# Patient Record
Sex: Female | Born: 1965
Health system: Southern US, Community
[De-identification: ages and names within clinical notes are randomized; demographics above are authoritative.]

## PROBLEM LIST (undated history)

## (undated) DIAGNOSIS — L509 Urticaria, unspecified: Secondary | ICD-10-CM

## (undated) DIAGNOSIS — R87629 Unspecified abnormal cytological findings in specimens from vagina: Secondary | ICD-10-CM

## (undated) DIAGNOSIS — E890 Postprocedural hypothyroidism: Secondary | ICD-10-CM

## (undated) DIAGNOSIS — T7840XA Allergy, unspecified, initial encounter: Secondary | ICD-10-CM

## (undated) DIAGNOSIS — E0500 Thyrotoxicosis with diffuse goiter without thyrotoxic crisis or storm: Secondary | ICD-10-CM

## (undated) HISTORY — PX: TONSILLECTOMY AND ADENOIDECTOMY: SHX28

## (undated) HISTORY — PX: ESOPHAGOGASTRODUODENOSCOPY: SHX1529

## (undated) HISTORY — DX: Urticaria, unspecified: L50.9

## (undated) HISTORY — PX: COLONOSCOPY: SHX174

## (undated) HISTORY — DX: Thyrotoxicosis with diffuse goiter without thyrotoxic crisis or storm: E05.00

## (undated) HISTORY — DX: Postprocedural hypothyroidism: E89.0

## (undated) HISTORY — DX: Unspecified abnormal cytological findings in specimens from vagina: R87.629

## (undated) HISTORY — PX: OTHER SURGICAL HISTORY: SHX169

## (undated) HISTORY — DX: Allergy, unspecified, initial encounter: T78.40XA

## (undated) HISTORY — PX: LEEP: SHX91

---

## 1998-10-28 ENCOUNTER — Inpatient Hospital Stay (HOSPITAL_COMMUNITY): Admission: AD | Admit: 1998-10-28 | Discharge: 1998-10-29 | Payer: Self-pay | Admitting: Obstetrics & Gynecology

## 1998-12-09 ENCOUNTER — Other Ambulatory Visit: Admission: RE | Admit: 1998-12-09 | Discharge: 1998-12-09 | Payer: Self-pay | Admitting: Obstetrics & Gynecology

## 1999-01-12 ENCOUNTER — Encounter: Admission: RE | Admit: 1999-01-12 | Discharge: 1999-04-05 | Payer: Self-pay | Admitting: Anesthesiology

## 1999-03-15 ENCOUNTER — Encounter: Admission: RE | Admit: 1999-03-15 | Discharge: 1999-06-13 | Payer: Self-pay | Admitting: Anesthesiology

## 1999-12-28 ENCOUNTER — Other Ambulatory Visit: Admission: RE | Admit: 1999-12-28 | Discharge: 1999-12-28 | Payer: Self-pay | Admitting: Obstetrics & Gynecology

## 2000-06-19 ENCOUNTER — Other Ambulatory Visit: Admission: RE | Admit: 2000-06-19 | Discharge: 2000-06-19 | Payer: Self-pay | Admitting: Obstetrics & Gynecology

## 2000-11-01 ENCOUNTER — Other Ambulatory Visit: Admission: RE | Admit: 2000-11-01 | Discharge: 2000-11-01 | Payer: Self-pay | Admitting: Obstetrics & Gynecology

## 2001-05-22 ENCOUNTER — Inpatient Hospital Stay (HOSPITAL_COMMUNITY): Admission: AD | Admit: 2001-05-22 | Discharge: 2001-05-24 | Payer: Self-pay | Admitting: Obstetrics & Gynecology

## 2001-05-22 ENCOUNTER — Encounter (INDEPENDENT_AMBULATORY_CARE_PROVIDER_SITE_OTHER): Payer: Self-pay | Admitting: Specialist

## 2001-07-23 ENCOUNTER — Other Ambulatory Visit: Admission: RE | Admit: 2001-07-23 | Discharge: 2001-07-23 | Payer: Self-pay | Admitting: Obstetrics & Gynecology

## 2002-08-20 ENCOUNTER — Other Ambulatory Visit: Admission: RE | Admit: 2002-08-20 | Discharge: 2002-08-20 | Payer: Self-pay | Admitting: Obstetrics & Gynecology

## 2003-09-15 ENCOUNTER — Other Ambulatory Visit: Admission: RE | Admit: 2003-09-15 | Discharge: 2003-09-15 | Payer: Self-pay | Admitting: Obstetrics & Gynecology

## 2004-10-06 ENCOUNTER — Other Ambulatory Visit: Admission: RE | Admit: 2004-10-06 | Discharge: 2004-10-06 | Payer: Self-pay | Admitting: Obstetrics & Gynecology

## 2005-11-04 ENCOUNTER — Other Ambulatory Visit: Admission: RE | Admit: 2005-11-04 | Discharge: 2005-11-04 | Payer: Self-pay | Admitting: Obstetrics and Gynecology

## 2010-04-29 ENCOUNTER — Encounter: Payer: Self-pay | Admitting: Critical Care Medicine

## 2010-06-15 ENCOUNTER — Ambulatory Visit: Payer: Self-pay | Admitting: Critical Care Medicine

## 2010-06-15 DIAGNOSIS — R059 Cough, unspecified: Secondary | ICD-10-CM | POA: Insufficient documentation

## 2010-06-15 DIAGNOSIS — E0590 Thyrotoxicosis, unspecified without thyrotoxic crisis or storm: Secondary | ICD-10-CM | POA: Insufficient documentation

## 2010-06-15 DIAGNOSIS — R05 Cough: Secondary | ICD-10-CM | POA: Insufficient documentation

## 2010-06-17 ENCOUNTER — Encounter: Payer: Self-pay | Admitting: Critical Care Medicine

## 2010-07-05 ENCOUNTER — Ambulatory Visit: Payer: Self-pay | Admitting: Critical Care Medicine

## 2010-07-05 DIAGNOSIS — J309 Allergic rhinitis, unspecified: Secondary | ICD-10-CM | POA: Insufficient documentation

## 2010-07-05 DIAGNOSIS — K219 Gastro-esophageal reflux disease without esophagitis: Secondary | ICD-10-CM | POA: Insufficient documentation

## 2010-10-19 NOTE — Assessment & Plan Note (Signed)
Summary: Pulmonary Consultation   Copy to:  Dr. Trinna Post Primary Provider/Referring Provider:  na  CC:  Pulmonary Consult for cough..  History of Present Illness: Pulmonary Consultation     This is a 45 year old female who presents with cough.  The patient complains of cough, but denies history of diagnosed COPD, shortness of breath, chest tightness, chest pain worse with breathing and coughing, wheezing, mucous production, nocturnal awakening, exercise induced symptoms, and congestion.  The cough is described as non-productive, perceived to be from upper airway, does not interrupt sleep, does not occur after eating, and without wheezing.  The dyspnea is described as insignificant.  The patient reports a history of thyroid disease.  The patient denies any history of asthma, allergic rhinitis, COPD, sleep disordered breathing, obstructive sleep apnea, heart disease, anemia, collagen vascular disease, osteporosis, kyphoscoliosis, occupational exposure: , and cancer: .  Ineffective therapies have included beta agonists, antihistamines, and PPI's.    this pt has had a dry cough since 7/11.   ? trigger.  Pt had this in the past but not as severe as now. Past trigger was sinus infxn but pt denies one now.  The pt awakens with chest pain that lasts .  Cough is dry.  No cough at bedtime.  Cough is dry only.  No heartburn , no sour brash taste.  Pt feels chest is tight and heavy.    No pn drip.  Pt took antihistamine and ppi for one month without much change.  The pt has never been dx asthma .  The pt  was rx with albuterol and no effect  The pt had allergy blood test and was neg. The pt is not hoarse  The pt has had this type of cough before but always around a sinus infxn rx pred x 10days with no help. zpak did not make any difference     never dx asthma  was rx with albuterol and no effect  had allergy blood test and was neg. is not hoarse  has had this type of cough before but  always around a sinus infxn rx pred x 10days with no help. zpak did not make any difference   Preventive Screening-Counseling & Management  Alcohol-Tobacco     Smoking Status: never     Passive Smoke Exposure: yes  Pulmonary Function Test Date: 06/15/2010 Height (in.): 67 Gender: Female  Pre-Spirometry FVC    Value: 3.84 L/min   Pred: 3.85 L/min     % Pred: 100 % FEV1    Value: 3.12 L     Pred: 2.98 L     % Pred: 105 % FEV1/FVC  Value: 81 %     Pred: 77 %    FEF 25-75  Value: 3.44 L/min   Pred: 3.31 L/min     % Pred: 104 %  Current Medications (verified): 1)  Synthroid 112 Mcg Tabs (Levothyroxine Sodium) .... Take 1 Tablet By Mouth Once A Day 2)  Birth Control .... Take 1 Tablet By Mouth Once A Day  Allergies (verified): No Known Drug Allergies  Past History:  Past medical, surgical, family and social histories (including risk factors) reviewed, and no changes noted (except as noted below).  Past Medical History: HYPERTHYROIDISM (ICD-242.90)  Past Surgical History: Tonsillectomy at age 76  Family History: Reviewed history and no changes required. father deceased from lung ca   Social History: Reviewed history and no changes required. 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  The patient denies shortness of breath with activity, productive cough, coughing up blood, irregular heartbeats, acid heartburn, indigestion, loss of appetite, weight change, abdominal pain, difficulty swallowing, sore throat, tooth/dental problems, headaches, nasal congestion/difficulty breathing through nose, sneezing, itching, ear ache, anxiety, depression, hand/feet swelling, joint stiffness or pain, rash, change in color of mucus, and fever.        See HPI for Pulmonary, Cardiac,  General, and ENT review of systems.  Vital Signs:  Patient profile:   45 year old female Height:      67 inches Weight:      138.38 pounds BMI:     21.75 O2 Sat:      97 % on Room air Temp:     98.6 degrees F oral Pulse rate:   96 / minute BP sitting:   108 / 86  (left arm) Cuff size:   regular  Vitals Entered By: Gweneth Dimitri RN (June 15, 2010 2:48 PM)  O2 Flow:  Room air CC: Pulmonary Consult for cough. Comments Medications reviewed with patient Daytime contact number verified with patient. Gweneth Dimitri RN  June 15, 2010 2:48 PM    Physical Exam  Additional Exam:  Gen: Pleasant, well-nourished, in no distress,  normal affect ENT: No lesions,  mouth clear,  oropharynx clear, no postnasal drip Neck: No JVD, no TMG, no carotid bruits Lungs: No use of accessory muscles, no dullness to percussion, clear without rales or rhonchi Cardiovascular: RRR, heart sounds normal, no murmur or gallops, no peripheral edema Abdomen: soft and NT, no HSM,  BS normal Musculoskeletal: No deformities, no cyanosis or clubbing Neuro: alert, non focal Skin: Warm, no lesions or rashes    CXR  Procedure date:  04/29/2010  Findings:      No active disease   Pulmonary Function Test Date: 06/15/2010 Height (in.): 67 Gender: Female  Pre-Spirometry FVC    Value: 3.84 L/min   Pred: 3.85 L/min     % Pred: 100 % FEV1    Value: 3.12 L     Pred: 2.98 L     % Pred: 105 % FEV1/FVC  Value: 81 %     Pred: 77 %    FEF 25-75  Value: 3.44 L/min   Pred: 3.31 L/min     % Pred: 104 %  Impression & Recommendations:  Problem # 1:  COUGH (ICD-786.2) Assessment Deteriorated Cyclic cough with mulitple potential triggers including chronic rhinitis, GERD, poss reactive airway disease, atopic,  note high IgE 42 though spirometry was normal plan trial ICS asmanex two puff daily pulse prednisone cyclic cough protocol with tussicaps/tessalon PPI daily chlorpheniramine 12mg /d  Orders: New  Patient Level V (45409) T-IgE (Immunoglobulin E) (81191-47829) Spirometry w/Graph (94010)  Medications Added to Medication List This Visit: 1)  Synthroid 112 Mcg Tabs (Levothyroxine sodium) .... Take 1 tablet by mouth once a day 2)  Birth Control  .... Take 1 tablet by mouth once a day 3)  Prednisone 10 Mg Tabs (Prednisone) .... Take as directed 4 each am x3days, 3 x 3days, 2 x 3days, 1 x 3days then stop 4)  Asmanex 120 Metered Doses 220 Mcg/inh Aepb (Mometasone furoate) .... Two puffs nightly 5)  Benzonatate 100 Mg Caps (Benzonatate) .... One to two every 4hours as needed cough 6)  Chlorpheniramine Maleate 12 Mg Cr-tabs (Chlorpheniramine maleate) .... One by mouth at bedtime 7)  Tussicaps 10-8 Mg Xr12h-cap (Hydrocod polst-chlorphen polst) .... One by mouth two times a day as needed cough 8)  Omeprazole 20 Mg Cpdr (Omeprazole) .... By mouth daily. take one half hour before eating.  Complete Medication List: 1)  Synthroid 112 Mcg Tabs (Levothyroxine sodium) .... Take 1 tablet by mouth once a day 2)  Birth Control  .... Take 1 tablet by mouth once a day 3)  Prednisone 10 Mg Tabs (Prednisone) .... Take as directed 4 each am x3days, 3 x 3days, 2 x 3days, 1 x 3days then stop 4)  Asmanex 120 Metered Doses 220 Mcg/inh Aepb (Mometasone furoate) .... Two puffs nightly 5)  Benzonatate 100 Mg Caps (Benzonatate) .... One to two every 4hours as needed cough 6)  Chlorpheniramine Maleate 12 Mg Cr-tabs (Chlorpheniramine maleate) .... One by mouth at bedtime 7)  Tussicaps 10-8 Mg Xr12h-cap (Hydrocod polst-chlorphen polst) .... One by mouth two times a day as needed cough 8)  Omeprazole 20 Mg Cpdr (Omeprazole) .... By mouth daily. take one half hour before eating.  Patient Instructions: 1)  Cyclic cough protocol using Tussicaps/Tessalon 2)  Start Asmanex two puff daily 3)  Recycle prednisone 4)  Resume omeprazole one daily 5)  Follow reflux diet 6)  Labs today 7)  No mints,  no peppermints,  no cough  drops, keep sugar free candy in mouth and train self to swallow 8)  Chlorpheniramine 12mg  nightly Prescriptions: OMEPRAZOLE 20 MG  CPDR (OMEPRAZOLE) By mouth daily. Take one half hour before eating.  #30 x 0   Entered and Authorized by:   Storm Frisk MD   Signed by:   Storm Frisk MD on 06/15/2010   Method used:   Electronically to        [facility] [address] [phone]* (retail)       [address]       [place], [place]  [address]       Ph: [phone] or [phone]       Fax: [phone]   RxID:   [id number] TUSSICAPS 10-8 MG XR12H-CAP (HYDROCOD POLST-CHLORPHEN POLST) One by mouth two times a day as needed cough  #14 x 0   Entered and Authorized by:   Storm Frisk MD   Signed by:   Storm Frisk MD on 06/15/2010   Method used:   Print then Give to Patient   RxID:   [id number] CHLORPHENIRAMINE MALEATE 12 MG CR-TABS (CHLORPHENIRAMINE MALEATE) one by mouth at bedtime  #30 x 1   Entered and Authorized by:   Storm Frisk MD   Signed by:   Storm Frisk MD on 06/15/2010   Method used:   Electronically to        [facility] [address] [phone]* (retail)       [address]       [place], [place]  [address]       Ph: [phone] or [phone]       Fax: [phone]   RxID:   [id number] BENZONATATE 100 MG CAPS (BENZONATATE) one to two every 4hours as needed cough  #90 x 1   Entered and Authorized by:   Storm Frisk MD   Signed by:   Storm Frisk MD on 06/15/2010   Method used:   Electronically to        [facility] [address] [phone]* (retail)  [address]       [place], [place]  [address]       Ph: [phone] or [phone]       Fax: [phone]   RxID:   [id number] [facility] 120 METERED DOSES 220 MCG/INH  AEPB (MOMETASONE FUROATE) two puffs nightly  #1 x 6   Entered and Authorized by:   Storm Frisk MD   Signed by:   Storm Frisk MD on 06/15/2010   Method used:   [facility]ronically to        [facility]  [address] [phone]* (retail)       [address]       [place], [place]  [address]       Ph: [phone] or [phone]       Fax: [phone]   RxID:   [id number] PREDNISONE 10 MG  TABS (PREDNISONE) Take as directed 4 each am x3days, 3 x 3days, 2 x 3days, 1 x 3days then stop  #30 x 0   Entered and Authorized by:   Storm Frisk MD   Signed by:   Storm Frisk MD on 06/15/2010   Method used:   [facility]ronically to        [facility] [phone]* (retail)       [address]       [place], [place]  [address]       Ph: [phone] or [phone]       Fax: [phone]   RxID:   [id number]    Immunization History:  Influenza Immunization History:    Influenza:  historical (06/19/2009)

## 2010-10-19 NOTE — Assessment & Plan Note (Signed)
Summary: Pulmonary OV   Copy to:  Dr. Trinna Post Primary Provider/Referring Provider:  na  CC:  2 wk follow up.  Pt states cough is 90% better.  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  this pt has had a dry cough since 7/11.   ? trigger.  Pt had this in the past but not as severe as now. Past trigger was sinus infxn but pt denies one now.  The pt awakens with chest pain that lasts .  Cough is dry.  No cough at bedtime.  Cough is dry only.  No heartburn , no sour brash taste.  Pt feels chest is tight and heavy.    No pn drip.  Pt took antihistamine and ppi for one month without much change.  The pt has never been dx asthma .  The pt  was rx with albuterol and no effect  The pt had allergy blood test and was neg. The pt is not hoarse  The pt has had this type of cough before but always around a sinus infxn rx pred x 10days with no help. zpak did not make any difference July 05, 2010 10:25 AM At last ov we rec: Cyclic cough protocol using Tussicaps/Tessalon Start Asmanex two puff daily Recycle prednisone Resume omeprazole one daily Follow reflux diet  The cough is not completely gone but is 90% better. The pt uses OTC chlorpheniramine prn no real heart burn.     THe igE was high Asmanex and GERD rx has helped.  Pt on reflux diet  Current Medications (verified): 1)  Synthroid 112 Mcg Tabs (Levothyroxine Sodium) .... Take 1 Tablet By Mouth Once A Day 2)  Birth Control .... Take 1 Tablet By Mouth Once A Day 3)  Asmanex 120 Metered Doses 220 Mcg/inh  Aepb (Mometasone Furoate) .... Two Puffs Nightly 4)  Benzonatate 100 Mg Caps (Benzonatate) .... One To Two Every 4hours As Needed Cough 5)  Chlorpheniramine Maleate 12 Mg Cr-Tabs (Chlorpheniramine Maleate) .... One By Mouth At Bedtime 6)  Tussicaps 10-8 Mg Xr12h-Cap (Hydrocod  Polst-Chlorphen Polst) .... One By Mouth Two Times A Day As Needed Cough 7)  Omeprazole 20 Mg  Cpdr (Omeprazole) .... By Mouth Daily. Take One Half Hour Before Eating.  Allergies (verified): No Known Drug Allergies  Past History:  Past medical, surgical, family and social histories (including risk factors) reviewed, and no changes noted (except as noted below).  Past Medical History: Reviewed history from 06/15/2010 and no changes required. HYPERTHYROIDISM (ICD-242.90)  Past Surgical History: Reviewed history from 06/15/2010 and no changes required. Tonsillectomy at age 41  Family History: Reviewed history from 06/15/2010 and no changes required. father deceased from lung ca   Social History: Reviewed history from 06/15/2010 and no changes required. 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  The patient denies shortness of breath with activity, shortness of breath at rest, productive cough, coughing up blood, chest pain, irregular heartbeats, acid heartburn, indigestion, loss of appetite, weight change, abdominal pain, difficulty swallowing, sore throat, tooth/dental problems, headaches, nasal congestion/difficulty breathing through nose, sneezing, itching, ear ache, anxiety, depression, hand/feet swelling, joint stiffness or pain, rash, change in color of mucus, and fever.    Vital Signs:  Patient profile:  45 year old female Height:      67 inches Weight:      138.25 pounds BMI:     21.73 O2 Sat:      96 % on Room air Temp:     98.2 degrees F oral Pulse rate:   97 / minute BP sitting:   98 / 66  (right arm) Cuff size:   regular  Vitals Entered By: Gweneth Dimitri RN (July 05, 2010 10:20 AM)  O2 Flow:  Room air CC: 2 wk follow up.  Pt states cough is 90% better.  Requesting flu vac today. Comments Medications reviewed with patient Daytime contact  number verified with patient. Crystal Jones RN  July 05, 2010 10:20 AM    Physical Exam  Additional Exam:  Gen: Pleasant, well-nourished, in no distress,  normal affect ENT: No lesions,  mouth clear,  oropharynx clear, no postnasal drip Neck: No JVD, no TMG, no carotid bruits Lungs: No use of accessory muscles, no dullness to percussion, clear without rales or rhonchi Cardiovascular: RRR, heart sounds normal, no murmur or gallops, no peripheral edema Abdomen: soft and NT, no HSM,  BS normal Musculoskeletal: No deformities, no cyanosis or clubbing Neuro: alert, non focal Skin: Warm, no lesions or rashes    Impression & Recommendations:  Problem # 1:  COUGH (ICD-786.2) Assessment Improved Cyclical cough due to cough variant asthma, GERD, allergic rhinitis with atopy, post nasal drip syndrome,  all better with GERD rx, antihistamine, ICS plan wean off PPI and ICS as needed antihistamine cont reflux diet flu vaccine Orders: Est. Patient Level III ([address])  Complete Medication List: 1)  Synthroid 112 Mcg Tabs (Levothyroxine sodium) .... Take 1 tablet by mouth once a day 2)  Birth Control  .... Take 1 tablet by mouth once a day 3)  Asmanex 120 Metered Doses 220 Mcg/inh Aepb (Mometasone furoate) .... Two puffs nightly 4)  Benzonatate 100 Mg Caps (Benzonatate) .... One to two every 4hours as needed cough 5)  Chlorpheniramine Maleate 12 Mg Cr-tabs (Chlorpheniramine maleate) .... One by mouth at bedtime 6)  Tussicaps 10-8 Mg Xr12h-cap (Hydrocod polst-chlorphen polst) .... One by mouth two times a day as needed cough 7)  Omeprazole 20 Mg Cpdr (Omeprazole) .... By mouth daily. take one half hour before eating.  Other Orders: Admin 1st Vaccine (91478) Flu Vaccine 35yrs + [phone])  Patient Instructions: 1)  When current asmanex runs out, do not refill 2)  Use chlorpheniramine as needed 3)  When current omeprazole runs out, do not refill 4)  Focus on reflux diet 5)  If cough  worsens, call for Office Visit 6)  Return 4-5 months for recheck 7)  Will hold off on skin testing for now 8)  Flu vaccine today           Flu Vaccine Consent Questions     Do you have a history of severe allergic reactions to this vaccine? no    Any prior history of allergic reactions to egg and/or gelatin? no    Do you have a sensitivity to the preservative Thimersol? no    Do you have a past history of Guillan-Barre Syndrome? no    Do you currently have an acute febrile illness? no    Have you ever had a severe reaction to latex? no    Vaccine information given and explained to patient? yes    Are you currently pregnant? no    Lot Number:[id number]   Exp Date:03/19/2011   Site Given  Left Deltoid IMlu Gweneth Dimitri RN  July 05, 2010 10:40 AM  Appended Document: Pulmonary OV fax Trinna Post

## 2010-10-19 NOTE — Miscellaneous (Signed)
Summary: Spirometry  Clinical Lists Changes  Observations: Added new observation of PFT RSLT: normal (06/17/2010 14:12) Added new observation of FEF % EXPEC: 104 % (06/17/2010 14:12) Added new observation of FEF25-75%PRE: 3.31 L/min (06/17/2010 14:12) Added new observation of FEF 25-75%: 3.44 L/min (06/17/2010 14:12) Added new observation of FEV1/FVC PRE: 77 % (06/17/2010 14:12) Added new observation of FEV1/FVC: 81 % (06/17/2010 14:12) Added new observation of FEV1 % EXP: 105 % (06/17/2010 14:12) Added new observation of FEV1 PREDICT: 2.98 L (06/17/2010 14:12) Added new observation of FEV1: 3.12 L (06/17/2010 14:12) Added new observation of FVC % EXPECT: 100 % (06/17/2010 14:12) Added new observation of FVC PREDICT: 3.85 L (06/17/2010 14:12) Added new observation of FVC: 3.84 L (06/17/2010 14:12) Added new observation of PFT DATE: 06/15/2010  (06/17/2010 14:12)      Pulmonary Function Test Date: 06/15/2010 Height (in.): 67 Gender: Female  Pre-Spirometry FVC    Value: 3.84 L/min   Pred: 3.85 L/min     % Pred: 100 % FEV1    Value: 3.12 L     Pred: 2.98 L     % Pred: 105 % FEV1/FVC  Value: 81 %     Pred: 77 %    FEF 25-75  Value: 3.44 L/min   Pred: 3.31 L/min     % Pred: 104 %  Evaluation: normal  Appended Document: Spirometry result noted  patient aware

## 2011-02-04 NOTE — Op Note (Signed)
[facility]  Patient:    Shelly James, RISSMILLER Visit Number: [id number] MRN: [id number]          Service Type: OBS Location: 910A [id number] [id number] Attending Physician:  Soledad Gerlach Dictated by:   Guy Sandifer Arleta Creek, M.D. Proc. Date: 05/22/01 Admit Date:  05/22/2001                             Operative Report  SURGEON:                      Guy Sandifer. Arleta Creek, M.D.  INDICATIONS AND CONSENT:      This patient is a 45 year old married white female, G2, P1 with an EDC of May 29, 2001, placing her at 39-0/7 weeks. Her prenatal care has been complicated by partial placental previa, which resolved up repeat ultrasound examination at approximately 24 weeks.  The patient presented to labor and delivery complaining of uterine contractions. Group B strep culture was negative.  She was noted to have more than the average amount of bleeding, possibly consistent with heavy show, but not consistent with continued stream of vaginal bleeding.  Her cervix was 5 cm dilated, completely effaced with a bulging bag.  Fetal heart tones were reactive and contractions were approximately every three minutes and firm.  IV access was started.  Laboratories were sent including a DIC panel, which subsequently returned as normal.  DELIVERY NOTE:                The patient was admitted to the hospital and taken to labor and delivery.  She requested and epidural.  Examination prior to the epidural was 8 cm dilation with a bulging bag.  Epidural was placed and a second IV was also started.  The patient continued to have bleeding which would soil a pad but, again, was not a free flow of dark red blood.  After placement of the epidural, she had some deep variable-shaped decelerations, which were late in occurrence.  With IV fluid resuscitation and oxygen, these gradually resolved.  Artificial rupture of membranes for clear fluid was carried out.  The patient made rapid  progress to complete and pushing and with +3 station.  She was then set up for delivery and easily achieved a spontaneous vaginal delivery for a vigorous infant female child with Apgars of 8 and 9 at one and five minutes respectively.  Arterial cord pH was 7.27. Birth weight is pending.  A three-vessel cord was noted.  Upon delivery of the placenta, a clot was attached to 1/4 of the margin of the placental diameter consistent with a marginal abruption.  The placenta was sent to pathology. The uterus massaged down well.  Good hemostasis was noted.  She had a small anterior midline laceration beneath the clitoris, which was repaired with a single figure-of-eight 2-0 Rapide suture.  A small second-degree midline episiotomy was also repaired in the standard fashion.  The patient and infant are stable in the labor and delivery room. Dictated by:   Guy Sandifer Arleta Creek, M.D. Attending Physician:  Soledad Gerlach DD:  05/22/01 TD:  05/23/01 Job: [id number]

## 2011-10-27 ENCOUNTER — Other Ambulatory Visit: Payer: Self-pay | Admitting: Obstetrics & Gynecology

## 2011-10-27 DIAGNOSIS — R928 Other abnormal and inconclusive findings on diagnostic imaging of breast: Secondary | ICD-10-CM

## 2011-11-04 ENCOUNTER — Ambulatory Visit
Admission: RE | Admit: 2011-11-04 | Discharge: 2011-11-04 | Disposition: A | Discharge status: Home / Self Care | Payer: BC Managed Care – PPO | Source: Ambulatory Visit | Attending: Obstetrics & Gynecology | Admitting: Obstetrics & Gynecology

## 2011-11-04 DIAGNOSIS — R928 Other abnormal and inconclusive findings on diagnostic imaging of breast: Secondary | ICD-10-CM

## 2011-12-24 ENCOUNTER — Ambulatory Visit (INDEPENDENT_AMBULATORY_CARE_PROVIDER_SITE_OTHER): Payer: BC Managed Care – PPO | Admitting: Internal Medicine

## 2011-12-24 ENCOUNTER — Ambulatory Visit: Payer: BC Managed Care – PPO

## 2011-12-24 VITALS — BP 108/70 | HR 84 | Temp 98.4°F | Resp 18 | Ht 66.0 in | Wt 140.0 lb

## 2011-12-24 DIAGNOSIS — R0602 Shortness of breath: Secondary | ICD-10-CM

## 2011-12-24 DIAGNOSIS — J9801 Acute bronchospasm: Secondary | ICD-10-CM

## 2011-12-24 DIAGNOSIS — R05 Cough: Secondary | ICD-10-CM

## 2011-12-24 MED ORDER — ALBUTEROL SULFATE (2.5 MG/3ML) 0.083% IN NEBU
2.5000 mg | INHALATION_SOLUTION | Freq: Once | RESPIRATORY_TRACT | Status: AC
  Administered 2011-12-24: 2.5 mg via RESPIRATORY_TRACT

## 2011-12-24 MED ORDER — HYDROCODONE-ACETAMINOPHEN 7.5-500 MG/15ML PO SOLN: 5.0000 mL | Freq: Four times a day (QID) | ORAL | Status: AC | PRN

## 2011-12-24 MED ORDER — IPRATROPIUM BROMIDE 0.02 % IN SOLN
0.5000 mg | Freq: Once | RESPIRATORY_TRACT | Status: AC
  Administered 2011-12-24: 0.5 mg via RESPIRATORY_TRACT

## 2011-12-24 MED ORDER — ALBUTEROL SULFATE HFA 108 (90 BASE) MCG/ACT IN AERS: 2.0000 | INHALATION_SPRAY | Freq: Four times a day (QID) | RESPIRATORY_TRACT | Status: DC | PRN

## 2011-12-24 MED ORDER — BENZONATATE 200 MG PO CAPS: 200.0000 mg | ORAL_CAPSULE | Freq: Two times a day (BID) | ORAL | Status: AC | PRN

## 2011-12-24 NOTE — Patient Instructions (Signed)

## 2011-12-24 NOTE — Progress Notes (Signed)
  Subjective:    Patient ID: Shelly James, female    DOB: 1966/06/18, 46 y.o.   MRN: [id number]  HPI Cough, sneezing, runny nose PHx of cycxlical cough Has allergys No SOB   Review of Systems     Objective:   Physical Exam  Constitutional: She is oriented to person, place, and time. She appears well-developed and well-nourished.  HENT:  Nose: Mucosal edema and rhinorrhea present. Right sinus exhibits no maxillary sinus tenderness and no frontal sinus tenderness. Left sinus exhibits no maxillary sinus tenderness and no frontal sinus tenderness.  Mouth/Throat: Uvula is midline, oropharynx is clear and moist and mucous membranes are normal.  Cardiovascular: Normal rate, regular rhythm and normal heart sounds.   Pulmonary/Chest: Effort normal. No respiratory distress. She has no decreased breath sounds. She has wheezes. She has rhonchi. She has no rales. She exhibits no tenderness.  Neurological: She is alert and oriented to person, place, and time.  Skin: Skin is warm and dry.    PFR 400, normal 450 Post 410 with louder wheezes      [facility] reading (PRIMARY) by  Dr.Katianne Barre NAD.        Assessment & Plan:  Cough Allergic rhinitis Bronchospasm  Neb good response, less cough and breaths easier Meds odered

## 2012-01-14 ENCOUNTER — Ambulatory Visit (INDEPENDENT_AMBULATORY_CARE_PROVIDER_SITE_OTHER): Payer: BC Managed Care – PPO | Admitting: Internal Medicine

## 2012-01-14 VITALS — BP 108/67 | HR 81 | Temp 97.5°F | Resp 16 | Ht 66.0 in | Wt 138.0 lb

## 2012-01-14 DIAGNOSIS — Z789 Other specified health status: Secondary | ICD-10-CM

## 2012-01-14 DIAGNOSIS — G4700 Insomnia, unspecified: Secondary | ICD-10-CM

## 2012-01-14 DIAGNOSIS — J309 Allergic rhinitis, unspecified: Secondary | ICD-10-CM

## 2012-01-14 DIAGNOSIS — J9801 Acute bronchospasm: Secondary | ICD-10-CM

## 2012-01-14 DIAGNOSIS — R05 Cough: Secondary | ICD-10-CM

## 2012-01-14 MED ORDER — ALPRAZOLAM 0.25 MG PO TABS: 0.2500 mg | ORAL_TABLET | Freq: Two times a day (BID) | ORAL | Status: AC | PRN

## 2012-01-14 MED ORDER — IPRATROPIUM BROMIDE 0.02 % IN SOLN
0.5000 mg | Freq: Once | RESPIRATORY_TRACT | Status: AC
  Administered 2012-01-14: 0.5 mg via RESPIRATORY_TRACT

## 2012-01-14 MED ORDER — METHYLPREDNISOLONE ACETATE 80 MG/ML IJ SUSP
80.0000 mg | Freq: Once | INTRAMUSCULAR | Status: AC
  Administered 2012-01-14: 80 mg via INTRAMUSCULAR

## 2012-01-14 MED ORDER — FLUTICASONE PROPIONATE 50 MCG/ACT NA SUSP: 2.0000 | Freq: Every day | NASAL | Status: DC

## 2012-01-14 MED ORDER — ALBUTEROL SULFATE (2.5 MG/3ML) 0.083% IN NEBU
2.5000 mg | INHALATION_SOLUTION | Freq: Once | RESPIRATORY_TRACT | Status: AC
  Administered 2012-01-14: 2.5 mg via RESPIRATORY_TRACT

## 2012-01-14 MED ORDER — BECLOMETHASONE DIPROPIONATE 80 MCG/ACT IN AERS: 1.0000 | INHALATION_SPRAY | RESPIRATORY_TRACT | Status: DC | PRN

## 2012-01-14 NOTE — Patient Instructions (Signed)
Prednisone tablets What is this medicine? PREDNISONE (PRED ni sone) is a corticosteroid. It is commonly used to treat inflammation of the skin, joints, lungs, and other organs. Common conditions treated include asthma, allergies, and arthritis. It is also used for other conditions, such as blood disorders and diseases of the adrenal glands. This medicine may be used for other purposes; ask your health care provider or pharmacist if you have questions. What should I tell my health care provider before I take this medicine? They need to know if you have any of these conditions: -Cushing's syndrome -diabetes -glaucoma -heart disease -high blood pressure -infection (especially a virus infection such as chickenpox, cold sores, or herpes) -kidney disease -liver disease -mental illness -myasthenia gravis -osteoporosis -seizures -stomach or intestine problems -thyroid disease -an unusual or allergic reaction to lactose, prednisone, other medicines, foods, dyes, or preservatives -pregnant or trying to get pregnant -breast-feeding How should I use this medicine? Take this medicine by mouth with a glass of water. Follow the directions on the prescription label. Take this medicine with food. If you are taking this medicine once a day, take it in the morning. Do not take more medicine than you are told to take. Do not suddenly stop taking your medicine because you may develop a severe reaction. Your doctor will tell you how much medicine to take. If your doctor wants you to stop the medicine, the dose may be slowly lowered over time to avoid any side effects. Talk to your pediatrician regarding the use of this medicine in children. Special care may be needed. Overdosage: If you think you have taken too much of this medicine contact a poison control center or emergency room at once. NOTE: This medicine is only for you. Do not share this medicine with others. What if I miss a dose? If you miss a dose,  take it as soon as you can. If it is almost time for your next dose, talk to your doctor or health care professional. You may need to miss a dose or take an extra dose. Do not take double or extra doses without advice. What may interact with this medicine? Do not take this medicine with any of the following medications: -metyrapone -mifepristone This medicine may also interact with the following medications: -aminoglutethimide -amphotericin B -aspirin and aspirin-like medicines -barbiturates -certain medicines for diabetes, like glipizide or glyburide -cholestyramine -cholinesterase inhibitors -cyclosporine -digoxin -diuretics -ephedrine -female hormones, like estrogens and birth control pills -isoniazid -ketoconazole -NSAIDS, medicines for pain and inflammation, like ibuprofen or naproxen -phenytoin -rifampin -toxoids -vaccines -warfarin This list may not describe all possible interactions. Give your health care provider a list of all the medicines, herbs, non-prescription drugs, or dietary supplements you use. Also tell them if you smoke, drink alcohol, or use illegal drugs. Some items may interact with your medicine. What should I watch for while using this medicine? Visit your doctor or health care professional for regular checks on your progress. If you are taking this medicine over a prolonged period, carry an identification card with your name and address, the type and dose of your medicine, and your doctor's name and address. This medicine may increase your risk of getting an infection. Tell your doctor or health care professional if you are around anyone with measles or chickenpox, or if you develop sores or blisters that do not heal properly. If you are going to have surgery, tell your doctor or health care professional that you have taken this medicine within the last   twelve months. Ask your doctor or health care professional about your diet. You may need to lower the amount  of salt you eat. This medicine may affect blood sugar levels. If you have diabetes, check with your doctor or health care professional before you change your diet or the dose of your diabetic medicine. What side effects may I notice from receiving this medicine? Side effects that you should report to your doctor or health care professional as soon as possible: -allergic reactions like skin rash, itching or hives, swelling of the face, lips, or tongue -changes in emotions or moods -changes in vision -depressed mood -eye pain -fever or chills, cough, sore throat, pain or difficulty passing urine -increased thirst -swelling of ankles, feet Side effects that usually do not require medical attention (report to your doctor or health care professional if they continue or are bothersome): -confusion, excitement, restlessness -headache -nausea, vomiting -skin problems, acne, thin and shiny skin -trouble sleeping -weight gain This list may not describe all possible side effects. Call your doctor for medical advice about side effects. You may report side effects to FDA at 1-800-FDA-1088. Where should I keep my medicine? Keep out of the reach of children. Store at room temperature between 15 and 30 degrees C (59 and 86 degrees F). Protect from light. Keep container tightly closed. Throw away any unused medicine after the expiration date. NOTE: This sheet is a summary. It may not cover all possible information. If you have questions about this medicine, talk to your doctor, pharmacist, or health care provider.  2012, [employer]. (04/21/2011 10:57:14 AM) 

## 2012-01-14 NOTE — Progress Notes (Signed)
  Subjective:    Patient ID: Shelly James, female    DOB: [birth year], 46 y.o.   MRN: [id number]  HPI Allergys, cough, wheezes have returned. Neb. Helped a lot, see last visit reviewed by me. No sxs of infection.   Review of Systems     Objective:   Physical Exam PFR 390 prior post 410 CXR report normal Nasal mucosa boggy edematous, lungs wheeze, rhonchi, cough spasms--all allergic in nature. Dr. Lucie Leather and [facility] her allergist.  Shelly James with albut and atrovent now       Assessment & Plan:  Start Qvar and fluticasone  Epocrates rev.  Depomedrol 80mg  IM

## 2012-06-28 ENCOUNTER — Ambulatory Visit (INDEPENDENT_AMBULATORY_CARE_PROVIDER_SITE_OTHER): Payer: BC Managed Care – PPO | Admitting: Family Medicine

## 2012-06-28 VITALS — BP 102/66 | HR 88 | Temp 98.0°F | Resp 16 | Ht 66.0 in | Wt 140.0 lb

## 2012-06-28 DIAGNOSIS — R230 Cyanosis: Secondary | ICD-10-CM

## 2012-06-28 DIAGNOSIS — J309 Allergic rhinitis, unspecified: Secondary | ICD-10-CM

## 2012-06-28 DIAGNOSIS — J3081 Allergic rhinitis due to animal (cat) (dog) hair and dander: Secondary | ICD-10-CM

## 2012-06-28 MED ORDER — PREDNISONE 20 MG PO TABS: ORAL_TABLET | ORAL | Status: DC

## 2012-06-28 NOTE — Progress Notes (Addendum)
[facility] [address], [place] [address] [phone]  Date:  06/28/2012   Name:  Shelly James   DOB:  [birth year]   MRN:  [id number]  PCP:  No primary provider on file.    Chief Complaint: Sinusitis, Cough and Hand Problem   History of Present Illness:  Shelly James is a 46 y.o. very pleasant female patient who presents with the following:  She is here today with allergy type symptoms which seem to have settled into her chest.  She feels that she cannot catch her breath.   She is using zyrtec, flonase and qvar.  Her symptoms started with nasal congestion, then a ST, now the wheezing and some cough.  He cough is dry.  She is also using albuterol neb nearly every day for the last couple of weeks.    She feels that she gets these symptoms twice every year- it is very predictable and frustrating for her.  Feels fatigued. No fever.  Her nose is congested and runny.  No sinus pressure or pain  She had an allergy blood test, but has not had scratch testing.  She is also baby- sitting a cat right now and wonders if she might be allergic.  One of her son's has a history of allergy  Yesterday she noted that her hands went blue- both.  Her sister has a history of Raynaud's.  She was outside watching a football game and she was chilly but not very cold.  Her hands felt tingly, but were not painful.  This lasted for a couple of hours until she got warmed up.  She took a warm bath and her hands warmed up and came back to normal.  Her hands are now back to normal- she has no other numbness or weakness.  Her TSH was checked within the last few months per Dr. Evlyn Kanner.   Patient Active Problem List  Diagnosis  . HYPERTHYROIDISM  . ALLERGIC RHINITIS  . GERD  . COUGH  . Bronchospasm    Past Medical History  Diagnosis Date  . Allergy   . Asthma     No past surgical history on file.  History  Substance Use Topics  . Smoking status: Never Smoker   .  Smokeless tobacco: Not on file  . Alcohol Use: Not on file    No family history on file.  Allergies  Allergen Reactions  . Hydrocodone     Medication list has been reviewed and updated.  Current Outpatient Prescriptions on File Prior to Visit  Medication Sig Dispense Refill  . albuterol (PROVENTIL HFA;VENTOLIN HFA) 108 (90 BASE) MCG/ACT inhaler Inhale 2 puffs into the lungs every 6 (six) hours as needed for wheezing.  1 Inhaler  0  . beclomethasone (QVAR) 80 MCG/ACT inhaler Inhale 1 puff into the lungs as needed.  1 Inhaler  12  . cetirizine (ZYRTEC) 10 MG tablet Take 10 mg by mouth daily.      . fluticasone (FLONASE) 50 MCG/ACT nasal spray Place 2 sprays into the nose daily.  16 g  6  . levothyroxine (SYNTHROID, LEVOTHROID) 112 MCG tablet Take 112 mcg by mouth daily.      . norethindrone-ethinyl estradiol (JUNEL FE,GILDESS FE,LOESTRIN FE) 1-20 MG-MCG tablet Take 1 tablet by mouth daily.        Review of Systems:  As per HPI- otherwise negative. Reports that this is exaclty what happens to her twice a year.  She is compliant  with all of her medicatoins   Physical Examination: Filed Vitals:   06/28/12 0806  BP: 102/66  Pulse: 88  Temp: 98 F (36.7 C)  Resp: 16   Filed Vitals:   06/28/12 0806  Height: 5\' 6"  (1.676 m)  Weight: 140 lb (63.504 kg)   Body mass index is 22.60 kg/(m^2). Ideal Body Weight: Weight in (lb) to have BMI = 25: 154.6   GEN: WDWN, NAD, Non-toxic, A & O x 3 HEENT: Atraumatic, Normocephalic. Neck supple. No masses, No LAD. Bilateral TM wnl, oropharynx normal.  PEERL,EOMI.   Nasal cavity is congested Ears and Nose: No external deformity. CV: RRR, No M/G/R. No JVD. No thrill. No extra heart sounds. PULM: CTA B, no wheezes, crackles, rhonchi. No retractions. No resp. distress. No accessory muscle use. EXTR: No c/c/e.  Hands are currently normal with normal perfusion, strength and sensation.  No swelling or tenderness in arms to suggest a clot or other  significant problem NEURO Normal gait.  PSYCH: Normally interactive. Conversant. Not depressed or anxious appearing.  Calm demeanor.    Assessment and Plan: 1. Allergic rhinitis  predniSONE (DELTASONE) 20 MG tablet  2. Cat allergies    3. Cyanosis, vasomotor     Flare of allergies and asthma.  Continue medications/ inhalers.  Will add a short burst of prednisone for her symptoms.  She may be developing Raynaud's.  Went over this with Enda- this can be a sign of an autoimmune disorder, or can be idiopathic.  She will watch for any recurrence of her symptoms and discuss with her PCP when she sees him for her annual exam soon.  Let me know if her symptoms are worse or if any other problems.  Meds ordered this encounter  Medications  . benzonatate (TESSALON) 100 MG capsule    Sig: Take 100 mg by mouth 3 (three) times daily as needed.  . predniSONE (DELTASONE) 20 MG tablet    Sig: Take 2 pills a day 3 days, then 1 pill a day for 3 days    Dispense:  18 tablet    Refill:  0     Manu Rubey, MD

## 2012-06-28 NOTE — Patient Instructions (Addendum)
We will use a brief steroid taper.  Let's have you take 2 prednisone pills for 3 days- you then may stop or take just one pill for 3 more days.

## 2012-08-22 ENCOUNTER — Other Ambulatory Visit: Payer: Self-pay | Admitting: Obstetrics & Gynecology

## 2012-08-22 DIAGNOSIS — Z1231 Encounter for screening mammogram for malignant neoplasm of breast: Secondary | ICD-10-CM

## 2012-11-07 ENCOUNTER — Other Ambulatory Visit: Payer: Self-pay | Admitting: Obstetrics & Gynecology

## 2012-11-07 ENCOUNTER — Ambulatory Visit
Admission: RE | Admit: 2012-11-07 | Discharge: 2012-11-07 | Disposition: A | Discharge status: Home / Self Care | Payer: BC Managed Care – PPO | Source: Ambulatory Visit | Attending: Obstetrics & Gynecology | Admitting: Obstetrics & Gynecology

## 2012-11-07 DIAGNOSIS — R928 Other abnormal and inconclusive findings on diagnostic imaging of breast: Secondary | ICD-10-CM

## 2012-11-07 DIAGNOSIS — Z1231 Encounter for screening mammogram for malignant neoplasm of breast: Secondary | ICD-10-CM

## 2012-11-12 ENCOUNTER — Ambulatory Visit
Admission: RE | Admit: 2012-11-12 | Discharge: 2012-11-12 | Disposition: A | Discharge status: Home / Self Care | Payer: BC Managed Care – PPO | Source: Ambulatory Visit | Attending: Obstetrics & Gynecology | Admitting: Obstetrics & Gynecology

## 2012-11-12 DIAGNOSIS — R928 Other abnormal and inconclusive findings on diagnostic imaging of breast: Secondary | ICD-10-CM

## 2013-12-12 ENCOUNTER — Other Ambulatory Visit: Payer: Self-pay

## 2013-12-12 DIAGNOSIS — Z1231 Encounter for screening mammogram for malignant neoplasm of breast: Secondary | ICD-10-CM

## 2014-01-21 ENCOUNTER — Ambulatory Visit: Payer: BC Managed Care – PPO

## 2014-01-30 ENCOUNTER — Ambulatory Visit
Admission: RE | Admit: 2014-01-30 | Discharge: 2014-01-30 | Disposition: A | Discharge status: Home / Self Care | Payer: BC Managed Care – PPO | Source: Ambulatory Visit

## 2014-01-30 ENCOUNTER — Encounter (INDEPENDENT_AMBULATORY_CARE_PROVIDER_SITE_OTHER): Payer: Self-pay

## 2014-01-30 DIAGNOSIS — Z1231 Encounter for screening mammogram for malignant neoplasm of breast: Secondary | ICD-10-CM

## 2015-01-02 ENCOUNTER — Other Ambulatory Visit: Payer: Self-pay

## 2015-01-02 DIAGNOSIS — Z1231 Encounter for screening mammogram for malignant neoplasm of breast: Secondary | ICD-10-CM

## 2015-02-06 ENCOUNTER — Ambulatory Visit: Payer: Self-pay

## 2015-02-18 ENCOUNTER — Ambulatory Visit: Payer: Self-pay

## 2015-03-13 ENCOUNTER — Ambulatory Visit: Payer: Self-pay

## 2015-03-25 ENCOUNTER — Ambulatory Visit
Admission: RE | Admit: 2015-03-25 | Discharge: 2015-03-25 | Disposition: A | Discharge status: Home / Self Care | Payer: BLUE CROSS/BLUE SHIELD | Source: Ambulatory Visit

## 2015-03-25 DIAGNOSIS — Z1231 Encounter for screening mammogram for malignant neoplasm of breast: Secondary | ICD-10-CM

## 2015-10-12 MED FILL — SYNTHROID 112 MCG TABLET: 112 | 90 days supply | Qty: 90 | Fill #0

## 2015-11-13 MED FILL — NORETHIND-ETH ESTRAD 1-0.02: 1-20 | 84 days supply | Qty: 63 | Fill #0

## 2015-11-17 DIAGNOSIS — H5203 Hypermetropia, bilateral: Secondary | ICD-10-CM | POA: Diagnosis not present

## 2015-11-17 DIAGNOSIS — H1045 Other chronic allergic conjunctivitis: Secondary | ICD-10-CM | POA: Diagnosis not present

## 2015-11-17 DIAGNOSIS — H524 Presbyopia: Secondary | ICD-10-CM | POA: Diagnosis not present

## 2015-11-17 DIAGNOSIS — H52223 Regular astigmatism, bilateral: Secondary | ICD-10-CM | POA: Diagnosis not present

## 2015-12-31 MED FILL — SYNTHROID 112 MCG TABLET: 112 | 90 days supply | Qty: 90 | Fill #1

## 2016-02-10 MED FILL — NORETHIND-ETH ESTRAD 1-0.02: 1-20 | 84 days supply | Qty: 63 | Fill #1

## 2016-03-08 DIAGNOSIS — E038 Other specified hypothyroidism: Secondary | ICD-10-CM | POA: Diagnosis not present

## 2016-03-08 DIAGNOSIS — Z0000 Encounter for general adult medical examination without abnormal findings: Secondary | ICD-10-CM | POA: Diagnosis not present

## 2016-03-14 DIAGNOSIS — E038 Other specified hypothyroidism: Secondary | ICD-10-CM | POA: Diagnosis not present

## 2016-03-14 DIAGNOSIS — L301 Dyshidrosis [pompholyx]: Secondary | ICD-10-CM | POA: Diagnosis not present

## 2016-03-14 DIAGNOSIS — E781 Pure hyperglyceridemia: Secondary | ICD-10-CM | POA: Diagnosis not present

## 2016-03-14 DIAGNOSIS — L508 Other urticaria: Secondary | ICD-10-CM | POA: Diagnosis not present

## 2016-03-14 DIAGNOSIS — N6019 Diffuse cystic mastopathy of unspecified breast: Secondary | ICD-10-CM | POA: Diagnosis not present

## 2016-03-14 DIAGNOSIS — G4709 Other insomnia: Secondary | ICD-10-CM | POA: Diagnosis not present

## 2016-03-14 DIAGNOSIS — Z0000 Encounter for general adult medical examination without abnormal findings: Secondary | ICD-10-CM | POA: Diagnosis not present

## 2016-03-14 DIAGNOSIS — G43809 Other migraine, not intractable, without status migrainosus: Secondary | ICD-10-CM | POA: Diagnosis not present

## 2016-03-14 DIAGNOSIS — K589 Irritable bowel syndrome without diarrhea: Secondary | ICD-10-CM | POA: Diagnosis not present

## 2016-04-08 MED FILL — SYNTHROID 112 MCG TABLET: 112 | 90 days supply | Qty: 90 | Fill #2

## 2016-04-19 ENCOUNTER — Ambulatory Visit (INDEPENDENT_AMBULATORY_CARE_PROVIDER_SITE_OTHER): Payer: Self-pay | Admitting: Allergy and Immunology

## 2016-04-19 ENCOUNTER — Encounter: Payer: Self-pay | Admitting: Allergy and Immunology

## 2016-04-19 VITALS — BP 126/84 | HR 86 | Temp 98.5°F | Resp 16 | Ht 67.0 in | Wt 144.6 lb

## 2016-04-19 DIAGNOSIS — R140 Abdominal distension (gaseous): Secondary | ICD-10-CM

## 2016-04-19 DIAGNOSIS — R109 Unspecified abdominal pain: Secondary | ICD-10-CM

## 2016-04-19 DIAGNOSIS — K219 Gastro-esophageal reflux disease without esophagitis: Secondary | ICD-10-CM | POA: Diagnosis not present

## 2016-04-19 DIAGNOSIS — L509 Urticaria, unspecified: Secondary | ICD-10-CM

## 2016-04-19 MED ORDER — RANITIDINE HCL 300 MG PO TABS: ORAL_TABLET | ORAL | 5 refills | Status: DC

## 2016-04-19 NOTE — Patient Instructions (Addendum)
  1. Allergen avoidance measures?  2. Blood - CBC w/diff, T.P., celiac screen with IgA, urease breath test, giardia stool antigen.  3. After testing completed utilize the following:   A. cetirizine 10 mg one tablet once a day  B. ranitidine 300 mg one tablet once a day  4. Can add Benadryl if needed  5. Evaluation with gastroenterologist at [facility] [place]  6. Return to clinic in 3 weeks or earlier if problem

## 2016-04-19 NOTE — Progress Notes (Signed)
NEW PATIENT NOTE  Referring Provider: No ref. provider found Primary Provider: Julian Hy, MD Date of office visit: 04/19/2016    Subjective:   Chief Complaint:  Shelly James (DOB: 1966-02-22) is a 50 y.o. female with a chief complaint of Urticaria (6 months with hives at random times from unknown source) and Allergy Testing (possible allergic reaction to mold and peanuts)  who presents to the clinic on 04/19/2016 with the following problems:  HPI: Shelly James presents to this clinic in evaluation of 2 main issues.  First, she has been having urticaria over the course of the past 6 months manifested as itchy dots and splotchy areas lasting less than one hour and never healing with scar or hyperpigmentation and not associated with any systemic or constitutional symptoms. Over the course of the past 2 months she has developed these issues on her face about twice a month. The frequency of her reactions are about every other week or so. There is a definite pressure induced trigger but no other triggers. She has not had a significant environmental change in her life while this has been occurring. She does not have a history of other atopic disease.  Second, for the past 6 months or so she's been having stomach issues that have been progressive. She has daily cramping. She gets abdominal cramping and she gets left flank pain when she does have this cramping. This is associated with bloating. Sometimes she'll go days without a stool and sometimes she'll develop a soft stools per day or 2. In addition, she's been having progressive heartburn with sternal burning over the course of the past few months.  She also has a history of some hand eczema involving her right hand intermittently over the course the past several years for which she's been given a prescription cream which works pretty well. She also has a history of developing sinus problems in the past but this has been inactive for  5 years. She states that when eating peanut M&M's it feels as though she gets a lump in her throat that lasts less than an hour or so. She still continues to eat these food products.  Past Medical History:  Diagnosis Date  . Allergy   . Asthma   . Urticaria     History reviewed. No pertinent surgical history.    Medication List      levothyroxine 112 MCG tablet Commonly known as:  SYNTHROID, LEVOTHROID Take 112 mcg by mouth daily.   norethindrone-ethinyl estradiol 1-20 MG-MCG tablet Commonly known as:  JUNEL FE,GILDESS FE,LOESTRIN FE Take 1 tablet by mouth daily.       Allergies  Allergen Reactions  . Hydrocodone Hives    Review of systems negative except as noted in HPI / PMHx or noted below:  Review of Systems  Constitutional: Negative.   HENT: Negative.   Eyes: Negative.   Respiratory: Negative.   Cardiovascular: Negative.   Gastrointestinal: Negative.   Genitourinary: Negative.   Musculoskeletal: Negative.   Skin: Negative.   Neurological: Negative.   Endo/Heme/Allergies: Negative.   Psychiatric/Behavioral: Negative.     Family History  Problem Relation Age of Onset  . Allergic rhinitis Neg Hx   . Angioedema Neg Hx   . Asthma Neg Hx   . Immunodeficiency Neg Hx   . Urticaria Neg Hx   . Eczema Neg Hx     Social History   Social History  . Marital status: Single    Spouse name: N/A  .  Number of children: N/A  . Years of education: N/A   Occupational History  . Not on file.   Social History Main Topics  . Smoking status: Never Smoker  . Smokeless tobacco: Never Used  . Alcohol use Not on file  . Drug use: No  . Sexual activity: Not on file   Other Topics Concern  . Not on file   Social History Narrative  . No narrative on file    Environmental and Social history  Lives in a house with a dry environment, no animals located inside the household, no carpeting in the bedroom, no plastic on the better pillow, and no smokers located  inside the household   Objective:   Vitals:   04/19/16 1345  BP: 126/84  Pulse: 86  Resp: 16  Temp: 98.5 F (36.9 C)   Height:  (170.2 cm) Weight: 144 lb 9.6 oz (65.6 kg)  Physical Exam  Constitutional: She is well-developed, well-nourished, and in no distress.  HENT:  Head: Normocephalic. Head is without right periorbital erythema and without left periorbital erythema.  Right Ear: Tympanic membrane, external ear and ear canal normal.  Left Ear: Tympanic membrane, external ear and ear canal normal.  Nose: Nose normal. No mucosal edema or rhinorrhea.  Mouth/Throat: Oropharynx is clear and moist and mucous membranes are normal. No oropharyngeal exudate.  Eyes: Conjunctivae and lids are normal. Pupils are equal, round, and reactive to light.  Neck: Trachea normal. No tracheal deviation present. No thyromegaly present.  Cardiovascular: Normal rate, regular rhythm, S1 normal, S2 normal and normal heart sounds.   No murmur heard. Pulmonary/Chest: Effort normal. No stridor. No tachypnea. No respiratory distress. She has no wheezes. She has no rales. She exhibits no tenderness.  Abdominal: Soft. She exhibits no distension and no mass. There is no hepatosplenomegaly. There is no tenderness. There is no rebound and no guarding.  Musculoskeletal: She exhibits no edema or tenderness.  Lymphadenopathy:       Head (right side): No tonsillar adenopathy present.       Head (left side): No tonsillar adenopathy present.    She has no cervical adenopathy.    She has no axillary adenopathy.  Neurological: She is alert. Gait normal.  Skin: No rash noted. She is not diaphoretic. No erythema. No pallor. Nails show no clubbing.  Psychiatric: Mood and affect normal.     Diagnostics: Allergy skin tests were performed. She did not demonstrate any hypersensitivity against a screening panel of foods.   Assessment and Plan:    1. Urticaria   2. Abdominal cramping   3. Abdominal bloating   4.  Gastroesophageal reflux disease, esophagitis presence not specified     1. Allergen avoidance measures?  2. Blood - CBC w/diff, T.P., celiac screen with IgA, urease breath test, giardia stool antigen.  3. After testing completed utilize the following:   A. cetirizine 10 mg one tablet once a day  B. ranitidine 300 mg one tablet once a day  4. Can add Benadryl if needed  5. Evaluation with gastroenterologist at [facility] [place]  6. 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 I will obtain a few blood tests in investigation of this issue as noted above. Given the fact that the onset of her urticaria and the duration of her urticaria correlates with her GI symptoms which have also been  active over the course of the past 6 months I think we need to exclude the possibility that she has developed an infection with Helicobacter pylori and giardia that may be contributing to her immunological hyperreactivity. With urticaria and GI syndromes there is also the possibility of mastocytosis but that would be much less common than some of these other etiologic factors and will hold off on any further evaluation for this issue at this point. As well, we will check her for thyroiditis by screening her with a thyroid peroxidase antibody which may be contributing to her immunological activity. I'm going to have her use an H1 and H2 receptor blocker in combination to address her issues and I think she be best served by seeing a gastroenterologist concerning further management of this issue assuming all her blood tests are going to be normal.  Laurette Schimke, MD [facility]

## 2016-04-20 DIAGNOSIS — R109 Unspecified abdominal pain: Secondary | ICD-10-CM | POA: Diagnosis not present

## 2016-04-20 DIAGNOSIS — R140 Abdominal distension (gaseous): Secondary | ICD-10-CM | POA: Diagnosis not present

## 2016-04-21 LAB — CBC WITH DIFFERENTIAL/PLATELET
BASOS: 1 %
Basophils Absolute: 0 10*3/uL (ref 0.0–0.2)
EOS (ABSOLUTE): 0.3 10*3/uL (ref 0.0–0.4)
EOS: 4 %
HEMOGLOBIN: 12.7 g/dL (ref 11.1–15.9)
Hematocrit: 38.3 % (ref 34.0–46.6)
IMMATURE GRANS (ABS): 0 10*3/uL (ref 0.0–0.1)
IMMATURE GRANULOCYTES: 0 %
LYMPHS: 31 %
Lymphocytes Absolute: 2 10*3/uL (ref 0.7–3.1)
MCH: 30.8 pg (ref 26.6–33.0)
MCHC: 33.2 g/dL (ref 31.5–35.7)
MCV: 93 fL (ref 79–97)
MONOCYTES: 5 %
Monocytes Absolute: 0.3 10*3/uL (ref 0.1–0.9)
NEUTROS ABS: 3.7 10*3/uL (ref 1.4–7.0)
NEUTROS PCT: 59 %
PLATELETS: 369 10*3/uL (ref 150–379)
RBC: 4.12 x10E6/uL (ref 3.77–5.28)
RDW: 13.1 % (ref 12.3–15.4)
WBC: 6.3 10*3/uL (ref 3.4–10.8)

## 2016-04-21 LAB — THYROID PEROXIDASE ANTIBODY: Thyroperoxidase Ab SerPl-aCnc: 13 IU/mL (ref 0–34)

## 2016-04-21 LAB — CELIAC DISEASE PANEL
Endomysial IgA: NEGATIVE
IgA/Immunoglobulin A, Serum: 117 mg/dL (ref 87–352)
Transglutaminase IgA: <2 U/mL (ref 0–3)

## 2016-04-22 ENCOUNTER — Other Ambulatory Visit: Payer: Self-pay

## 2016-04-22 MED ORDER — RANITIDINE HCL 300 MG PO TABS: ORAL_TABLET | ORAL | 5 refills | Status: DC

## 2016-04-25 LAB — GIARDIA, EIA; OVA/PARASITE: Giardia Ag, Stl: NEGATIVE

## 2016-04-25 MED FILL — raNITIdine HCL 300 MG TABS: 300 | 30 days supply | Qty: 30 | Fill #0

## 2016-04-27 MED FILL — NORETHIND-ETH ESTRAD 1-0.02: 1-20 | 84 days supply | Qty: 63 | Fill #2

## 2016-05-03 ENCOUNTER — Other Ambulatory Visit: Payer: Self-pay | Admitting: Obstetrics & Gynecology

## 2016-05-03 DIAGNOSIS — Z1231 Encounter for screening mammogram for malignant neoplasm of breast: Secondary | ICD-10-CM

## 2016-05-05 DIAGNOSIS — L509 Urticaria, unspecified: Secondary | ICD-10-CM | POA: Diagnosis not present

## 2016-05-05 DIAGNOSIS — R109 Unspecified abdominal pain: Secondary | ICD-10-CM | POA: Diagnosis not present

## 2016-05-05 DIAGNOSIS — R140 Abdominal distension (gaseous): Secondary | ICD-10-CM | POA: Diagnosis not present

## 2016-05-07 LAB — H. PYLORI BREATH TEST: H. pylori UBiT: NEGATIVE

## 2016-05-11 ENCOUNTER — Encounter: Payer: Self-pay | Admitting: Allergy and Immunology

## 2016-05-11 ENCOUNTER — Ambulatory Visit (INDEPENDENT_AMBULATORY_CARE_PROVIDER_SITE_OTHER): Payer: 59 | Admitting: Allergy and Immunology

## 2016-05-11 VITALS — BP 110/70 | HR 80 | Resp 16

## 2016-05-11 DIAGNOSIS — K219 Gastro-esophageal reflux disease without esophagitis: Secondary | ICD-10-CM

## 2016-05-11 DIAGNOSIS — R109 Unspecified abdominal pain: Secondary | ICD-10-CM | POA: Diagnosis not present

## 2016-05-11 DIAGNOSIS — L509 Urticaria, unspecified: Secondary | ICD-10-CM

## 2016-05-11 DIAGNOSIS — R140 Abdominal distension (gaseous): Secondary | ICD-10-CM

## 2016-05-11 NOTE — Patient Instructions (Signed)
  1. Continue the following:   A. cetirizine 10 mg one tablet once a day  B. ranitidine 300 mg one tablet once a day  2. Can add Benadryl if needed  3. Evaluation [facility] [place]  4. Return to clinic in 12 weeks or earlier if problem  5. Obtain a fall flu vaccine

## 2016-05-11 NOTE — Progress Notes (Signed)
Follow-up Note  Referring Provider: Adrian PrinceSouth, Stephen, MD Primary Provider: Julian HySOUTH,STEPHEN ALAN, MD Date of Office Visit: 05/11/2016  Subjective:   Shelly RioMichelle E James (DOB: 08/19/1966) is a 50 y.o. female [facility] in re-evaluation of the following:  HPI: Shelly James returns to this clinic in reevaluation of her urticaria and her abdominal issues. Utilizing medical therapy established on one August 2017 she is much better regarding her urticaria. She's only had 1 small episode or urticaria that lasted about 1 hour. She's been consistently using her cetirizine.   As well, she probably has a little bit less issues with cramping of her abdomen and maybe a little bit more improvement with bloating since she started her ranitidine. She had to delay her ranitidine use until last week because she had to perform her urease breath test which required a washout of a previous H2 receptor blocker for several weeks. She still continues to have this linear oscillating constipation/ normal stool pattern/ diarrhea pattern. Heartburn has been improved. She still has an issue occasionally with food getting stuck in her chest about twice a week.  Some of the cramping pain that she describes appears to have a predilection for the left side and actually migrates around to the left flank. 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    Medication List      cetirizine 10 MG tablet Commonly known as:  ZYRTEC Take 10 mg by mouth daily.   levothyroxine 112 MCG tablet Commonly known as:  SYNTHROID, LEVOTHROID Take 112 mcg by mouth daily.   norethindrone-ethinyl estradiol 1-20 MG-MCG tablet Commonly known as:  JUNEL FE,GILDESS FE,LOESTRIN FE Take 1 tablet by mouth daily.   ranitidine 300 MG tablet Commonly known as:  ZANTAC Take one  tablet once daily       Past Medical History:  Diagnosis Date  . Allergy   . Asthma   . Urticaria     History reviewed. No pertinent surgical history.  Allergies  Allergen Reactions  . Hydrocodone Hives    Review of systems negative except as noted in HPI / PMHx or noted below:  Review of Systems  Constitutional: Negative.   HENT: Negative.   Eyes: Negative.   Respiratory: Negative.   Cardiovascular: Negative.   Gastrointestinal: Negative.   Genitourinary: Negative.   Musculoskeletal: Negative.   Skin: Negative.   Neurological: Negative.   Endo/Heme/Allergies: Negative.   Psychiatric/Behavioral: Negative.      Objective:   Vitals:   05/11/16 1039  BP: 110/70  Pulse: 80  Resp: 16          Physical Exam  Constitutional: She is well-developed, well-nourished, and in no distress.  HENT:  Head: Normocephalic.  Right Ear: Tympanic membrane, external ear and ear canal normal.  Left Ear: Tympanic membrane, external ear and ear canal normal.  Nose: Nose normal. No mucosal edema or rhinorrhea.  Mouth/Throat: Uvula is midline, oropharynx is clear and moist and mucous membranes are normal. No oropharyngeal exudate.  Eyes: Conjunctivae are normal.  Neck: Trachea normal. No tracheal tenderness present. No tracheal deviation present. No thyromegaly present.  Cardiovascular: Normal rate, regular rhythm, S1 normal, S2 normal and normal heart sounds.   No murmur heard. Pulmonary/Chest: Breath sounds normal. No stridor. No respiratory distress. She has no wheezes. She has no rales.  Musculoskeletal:  She exhibits no edema.  Lymphadenopathy:       Head (right side): No tonsillar adenopathy present.       Head (left side): No tonsillar adenopathy present.    She has no cervical adenopathy.  Neurological: She is alert. Gait normal.  Skin: No rash noted. She is not diaphoretic. No erythema. Nails show no clubbing.  Psychiatric: Mood and affect normal.    Diagnostics:   Results of blood tests obtained on one August 2017 identified a white blood cell count of 6.3 with a absolute eosinophil count of 300, and negative celiac screen, a negative celiac screen with an IgA of 117 MG/DL, negative stool ova and parasite and negative Giardia stool antigen, and a negative Helicobacter pylori urease breath test obtained on 05/05/2016.  Assessment and Plan:   1. Urticaria   2. Abdominal cramping   3. Abdominal bloating   4. Gastroesophageal reflux disease, esophagitis presence not specified     1. Continue the following:   A. cetirizine 10 mg one tablet once a day  B. ranitidine 300 mg one tablet once a day  2. Can add Benadryl if needed  3. [facility]  4. Return to clinic in 12 weeks or earlier if problem  5. Obtain a fall flu vaccine  Shelly James appears to be better regarding her urticaria and maybe a little bit better regarding her reflux but she still has this abdominal issue that needs to be evaluated by a gastroenterologist. For some reason the the referral that we made to see GI was not attended to and we are going to try and get that arranged sometime in the next week or 2. I will see her back in this clinic in approximately 12 weeks or earlier if there is a problem while she continues to use an H1 and H2 receptor blocker consistently.  Laurette SchimkeEric Ralf Konopka, MD [facility]

## 2016-05-12 ENCOUNTER — Encounter: Payer: Self-pay | Admitting: Internal Medicine

## 2016-06-01 ENCOUNTER — Ambulatory Visit: Payer: BLUE CROSS/BLUE SHIELD

## 2016-06-06 MED FILL — raNITIdine HCL 300 MG TABS: 300 | 30 days supply | Qty: 30 | Fill #1

## 2016-06-24 ENCOUNTER — Ambulatory Visit
Admission: RE | Admit: 2016-06-24 | Discharge: 2016-06-24 | Disposition: A | Discharge status: Home / Self Care | Payer: 59 | Source: Ambulatory Visit | Attending: Obstetrics & Gynecology | Admitting: Obstetrics & Gynecology

## 2016-06-24 DIAGNOSIS — Z1231 Encounter for screening mammogram for malignant neoplasm of breast: Secondary | ICD-10-CM | POA: Diagnosis not present

## 2016-07-11 MED FILL — SYNTHROID 112 MCG TABLET: 112 | 90 days supply | Qty: 90 | Fill #0

## 2016-07-20 MED FILL — NORETHIND-ETH ESTRAD 1-0.02: 1-20 | 84 days supply | Qty: 63 | Fill #3

## 2016-07-20 MED FILL — raNITIdine HCL 300 MG TABS: 300 | 30 days supply | Qty: 30 | Fill #2

## 2016-07-27 ENCOUNTER — Ambulatory Visit: Payer: BLUE CROSS/BLUE SHIELD | Admitting: Internal Medicine

## 2016-08-25 MED FILL — raNITIdine HCL 300 MG TABS: 300 | 30 days supply | Qty: 30 | Fill #3

## 2016-10-12 MED FILL — raNITIdine HCL 300 MG TABS: 300 | 30 days supply | Qty: 30 | Fill #4

## 2016-10-12 MED FILL — SYNTHROID 112 MCG TABLET: 112 | 90 days supply | Qty: 90 | Fill #1

## 2016-10-12 MED FILL — LARIN 21 1-20 TABLET: 1-20 | 21 days supply | Qty: 21 | Fill #0

## 2016-11-01 DIAGNOSIS — Z01419 Encounter for gynecological examination (general) (routine) without abnormal findings: Secondary | ICD-10-CM | POA: Diagnosis not present

## 2016-11-01 DIAGNOSIS — Z6823 Body mass index (BMI) 23.0-23.9, adult: Secondary | ICD-10-CM | POA: Diagnosis not present

## 2016-11-07 MED FILL — JUNEL 1-20 TABLET: 1-20 | 84 days supply | Qty: 63 | Fill #0

## 2016-11-15 ENCOUNTER — Encounter: Payer: Self-pay | Admitting: Internal Medicine

## 2016-11-15 MED FILL — raNITIdine HCL 300 MG TABS: 300 | 30 days supply | Qty: 30 | Fill #5

## 2016-11-21 ENCOUNTER — Telehealth: Payer: 59 | Admitting: Family

## 2016-11-21 DIAGNOSIS — J029 Acute pharyngitis, unspecified: Secondary | ICD-10-CM

## 2016-11-21 MED ORDER — BENZONATATE 100 MG PO CAPS: 100.0000 mg | ORAL_CAPSULE | Freq: Three times a day (TID) | ORAL | 0 refills | Status: DC | PRN

## 2016-11-21 MED ORDER — ALBUTEROL SULFATE HFA 108 (90 BASE) MCG/ACT IN AERS: 2.0000 | INHALATION_SPRAY | Freq: Four times a day (QID) | RESPIRATORY_TRACT | 2 refills | Status: DC | PRN

## 2016-11-21 MED FILL — BENZONATATE 100 MG CAP: 100 | 5 days supply | Qty: 30 | Fill #0

## 2016-11-21 MED FILL — VENTOLIN HFA 90 MCG INHALER: 108 (90 BAS | 25 days supply | Qty: 18 | Fill #0

## 2016-11-21 NOTE — Progress Notes (Signed)
We are sorry that you are not feeling well.  Here is how we plan to  help!   *Based on our detailed telephone discussion, this may be airway irritation from allergies or other irritants, but less likely to be arising from an infection. See treatment below:  Based on what you have shared with me it looks like you have upper respiratory tract inflammation that has resulted in a significant cough.  Inflammation and infection in the upper respiratory tract is commonly called bronchitis and has four common causes:  Allergies, Viral Infections, Acid Reflux and Bacterial Infections.  Allergies, viruses and acid reflux are treated by controlling symptoms or eliminating the cause. An example might be a cough caused by taking certain blood pressure medications. You stop the cough by changing the medication. Another example might be a cough caused by acid reflux. Controlling the reflux helps control the cough.   Based on your presentation I believe you most likely have A cough due to allergies.  I recommend that you start the an over-the counter-allergy medication such as Claritin 10 mg or Zyrtec 10 mg daily.     In addition you may use A non-prescription cough medication called Mucinex DM: take 2 tablets every 12 hours. and A prescription cough medication called Tessalon Perles 100mg . You may take 1-2 capsules every 8 hours as needed for your cough.  I have also ordered the Albuterol inhaler as we discussed, 2 puffs every 6 hours as needed for shortness of breath.   USE OF BRONCHODILATOR ("RESCUE") INHALERS: There is a risk from using your bronchodilator too frequently.  The risk is that over-reliance on a medication which only relaxes the muscles surrounding the breathing tubes can reduce the effectiveness of medications prescribed to reduce swelling and congestion of the tubes themselves.  Although you feel brief relief from the bronchodilator inhaler, your asthma may actually be worsening with the tubes  becoming more swollen and filled with mucus.  This can delay other crucial treatments, such as oral steroid medications. If you need to use a bronchodilator inhaler daily, several times per day, you should discuss this with your provider.  There are probably better treatments that could be used to keep your asthma under control.     HOME CARE . Only take medications as instructed by your medical team. . Complete the entire course of an antibiotic. . Drink plenty of fluids and get plenty of rest. . Avoid close contacts especially the very young and the elderly . Cover your mouth if you cough or cough into your sleeve. . Always remember to wash your hands . A steam or ultrasonic humidifier can help congestion.   GET HELP RIGHT AWAY IF: . You develop worsening fever. . You become short of breath . You cough up blood. . Your symptoms persist after you have completed your treatment plan MAKE SURE YOU   Understand these instructions.  Will watch your condition.  Will get help right away if you are not doing well or get worse.  Your e-visit answers were reviewed by a board certified advanced clinical practitioner to complete your personal care plan.  Depending on the condition, your plan could have included both over the counter or prescription medications. If there is a problem please reply  once you have received a response from your provider. Your safety is important to [place].  If you have drug allergies check your prescription carefully.    You can use MyChart to ask questions about today's visit,  request a non-urgent call back, or ask for a work or school excuse for 24 hours related to this e-Visit. If it has been greater than 24 hours you will need to follow up with your provider, or enter a new e-Visit to address those concerns. You will get an e-mail in the next two days asking about your experience.  I hope that your e-visit has been valuable and will speed your recovery. Thank you for  using e-visits.

## 2016-12-12 MED FILL — SYNTHROID 112 MCG TABLET: 112 | 6 days supply | Qty: 6 | Fill #2

## 2016-12-16 ENCOUNTER — Encounter (HOSPITAL_COMMUNITY): Payer: Self-pay | Admitting: *Deleted

## 2016-12-16 ENCOUNTER — Ambulatory Visit (HOSPITAL_COMMUNITY)
Admission: EM | Admit: 2016-12-16 | Discharge: 2016-12-16 | Disposition: A | Discharge status: Home / Self Care | Payer: 59 | Attending: Internal Medicine | Admitting: Internal Medicine

## 2016-12-16 DIAGNOSIS — J019 Acute sinusitis, unspecified: Secondary | ICD-10-CM

## 2016-12-16 DIAGNOSIS — R05 Cough: Secondary | ICD-10-CM

## 2016-12-16 DIAGNOSIS — B9689 Other specified bacterial agents as the cause of diseases classified elsewhere: Secondary | ICD-10-CM | POA: Diagnosis not present

## 2016-12-16 DIAGNOSIS — R053 Chronic cough: Secondary | ICD-10-CM

## 2016-12-16 MED ORDER — AMOXICILLIN-POT CLAVULANATE 875-125 MG PO TABS: 1.0000 | ORAL_TABLET | Freq: Two times a day (BID) | ORAL | 0 refills | Status: DC

## 2016-12-16 MED FILL — AMOX TR-K CLV 875-125 MG TA: 875-125 | 7 days supply | Qty: 14 | Fill #0

## 2016-12-16 NOTE — ED Provider Notes (Signed)
CSN: [id number]     Arrival date & time 12/16/16  8657 History   First MD Initiated Contact with Patient 12/16/16 1015     Chief Complaint  Patient presents with  . URI   (Consider location/radiation/quality/duration/timing/severity/associated sxs/prior Treatment) HPI  Shelly James is a 51 y.o. female presenting to UC with c/o 3-4 weeks of gradually worsening sinus pain and pressure that initially started with a mild intermittent productive cough and sore throat.  She did an E-visit about 3 weeks ago, was prescribed Tessalon and inhaler but now the pain and pressure is worsening in her sinuses. She has tried OTC cough/cold medication with mild temporary relief.  Hx of sinusitis in the past but not for several years. Denies fever, chills, n/v/d.    Past Medical History:  Diagnosis Date  . Allergy   . Asthma   . Urticaria    History reviewed. No pertinent surgical history. Family History  Problem Relation Age of Onset  . Allergic rhinitis Neg Hx   . Angioedema Neg Hx   . Asthma Neg Hx   . Immunodeficiency Neg Hx   . Urticaria Neg Hx   . Eczema Neg Hx    Social History  Substance Use Topics  . Smoking status: Never Smoker  . Smokeless tobacco: Never Used  . Alcohol use Not on file   OB History    No data available     Review of Systems  Constitutional: Negative for chills and fever.  HENT: Positive for congestion, ear pain, postnasal drip, rhinorrhea, sinus pain, sinus pressure and sore throat. Negative for trouble swallowing and voice change.   Respiratory: Positive for cough. Negative for shortness of breath.   Cardiovascular: Negative for chest pain and palpitations.  Gastrointestinal: Negative for abdominal pain, diarrhea, nausea and vomiting.  Musculoskeletal: Negative for arthralgias, back pain and myalgias.  Skin: Negative for rash.  Neurological: Positive for headaches ( frontal). Negative for dizziness and light-headedness.    Allergies   Hydrocodone  Home Medications   Prior to Admission medications   Medication Sig Start Date End Date Taking? Authorizing Provider  albuterol (PROVENTIL HFA;VENTOLIN HFA) 108 (90 Base) MCG/ACT inhaler Inhale 2 puffs into the lungs every 6 (six) hours as needed for wheezing or shortness of breath. 11/21/16   Beau Fanny, FNP  amoxicillin-clavulanate (AUGMENTIN) 875-125 MG tablet Take 1 tablet by mouth 2 (two) times daily. One po bid x 7 days 12/16/16   Junius Finner, PA-C  benzonatate (TESSALON PERLES) 100 MG capsule Take 1-2 capsules (100-200 mg total) by mouth every 8 (eight) hours as needed for cough. 11/21/16   Beau Fanny, FNP  cetirizine (ZYRTEC) 10 MG tablet Take 10 mg by mouth daily.    Historical Provider, MD  levothyroxine (SYNTHROID, LEVOTHROID) 112 MCG tablet Take 112 mcg by mouth daily.    Historical Provider, MD  norethindrone-ethinyl estradiol (JUNEL FE,GILDESS FE,LOESTRIN FE) 1-20 MG-MCG tablet Take 1 tablet by mouth daily.    Historical Provider, MD  ranitidine (ZANTAC) 300 MG tablet Take one tablet once daily 04/22/16   Jessica Priest, MD   Meds Ordered and Administered this Visit  Medications - No data to display  BP 126/86 (BP Location: Right Arm)   Pulse 78   Temp 98.6 F (37 C) (Oral)   Resp 18   LMP 11/25/2016   SpO2 98%  No data found.   Physical Exam  Constitutional: She appears well-developed and well-nourished. No distress.  HENT:  Head: Normocephalic and  atraumatic.  Right Ear: Tympanic membrane normal.  Left Ear: Tympanic membrane normal.  Nose: Mucosal edema present. Right sinus exhibits maxillary sinus tenderness and frontal sinus tenderness. Left sinus exhibits maxillary sinus tenderness and frontal sinus tenderness.  Mouth/Throat: Uvula is midline, oropharynx is clear and moist and mucous membranes are normal.  Eyes: Conjunctivae are normal. No scleral icterus.  Neck: Normal range of motion. Neck supple.  Cardiovascular: Normal rate, regular rhythm  and normal heart sounds.   Pulmonary/Chest: Effort normal and breath sounds normal. No stridor. No respiratory distress. She has no wheezes. She has no rales.  Intermittent mildly productive cough during exam.  Abdominal: Soft. She exhibits no distension. There is no tenderness.  Musculoskeletal: Normal range of motion.  Lymphadenopathy:    She has no cervical adenopathy.  Neurological: She is alert.  Skin: Skin is warm and dry. She is not diaphoretic.  Nursing note and vitals reviewed.   Urgent Care Course     Procedures (including critical care time)  Labs Review Labs Reviewed - No data to display  Imaging Review No results found.    MDM   1. Acute bacterial sinusitis   2. Persistent cough    Hx and exam c/w sinusitis secondary to URI  Pt declined prednisone stating is causes her anxiety.  Rx: Augmentin  Advised pt she can try albuterol inhaler every 4-6 hours if cough fits start.  Encouraged sinus rinses. f/u with PCP as needed in 1 week if not improving.    Junius Finner, PA-C 12/16/16 1050

## 2016-12-16 NOTE — Discharge Instructions (Signed)
° °  You may try to use your inhaler for when cough is getting bad.  You may use every 4-6 hours 2 puffs at a time.

## 2016-12-16 NOTE — ED Triage Notes (Signed)
Pt    Reports       Symptoms      Of     Cough   Congested      Sinus   Drainage       X  Over I  Month     She   Reports      That     sorethroat  As   Well   Pt  Ambulated  To  Exam room appears  In no  Acute  Distress

## 2016-12-23 ENCOUNTER — Telehealth: Payer: 59 | Admitting: Family

## 2016-12-23 DIAGNOSIS — R05 Cough: Secondary | ICD-10-CM | POA: Diagnosis not present

## 2016-12-23 DIAGNOSIS — R059 Cough, unspecified: Secondary | ICD-10-CM

## 2016-12-23 MED ORDER — PREDNISONE 10 MG PO TABS: 10.0000 mg | ORAL_TABLET | ORAL | 0 refills | Status: DC

## 2016-12-23 MED ORDER — BENZONATATE 100 MG PO CAPS: 100.0000 mg | ORAL_CAPSULE | Freq: Three times a day (TID) | ORAL | 0 refills | Status: DC | PRN

## 2016-12-23 MED FILL — predniSONE 10 MG (21) TBPK: 10 | 6 days supply | Qty: 21 | Fill #0

## 2016-12-23 MED FILL — BENZONATATE 100 MG CAP: 100 | 5 days supply | Qty: 30 | Fill #0

## 2016-12-23 NOTE — Progress Notes (Signed)
We are sorry that you are not feeling well.  Here is how we plan to help!  Based on what you have shared with me it looks like you have upper respiratory tract inflammation that has resulted in a significant cough.  Inflammation and infection in the upper respiratory tract is commonly called bronchitis and has four common causes:  Allergies, Viral Infections, Acid Reflux and Bacterial Infections.  Allergies, viruses and acid reflux are treated by controlling symptoms or eliminating the cause. An example might be a cough caused by taking certain blood pressure medications. You stop the cough by changing the medication. Another example might be a cough caused by acid reflux. Controlling the reflux helps control the cough.  Based on your presentation I believe you most likely have A cough due to bacteria.  When patients have a fever and a productive cough with a change in color or increased sputum production, we are concerned about bacterial bronchitis.  If left untreated it can progress to pneumonia.  If your symptoms do not improve with your treatment plan it is important that you contact your provider.    *Given you completed the antibiotics today, giving more antibiotics at this time would not be productive as they are still in your system and working. This appears to be lingering inflammation from your infection and we need to decrease that inflammation to help with your chest tightness. Continue to use the albuterol inhaler for now as well as the recommendations below. The treatment below is aimed at improving the symptoms as the virus/bacteria is likely eradicated by this point. If you don't feel better in a couple days, you may need to be seen face-to-face again for further evaluation which may even include a chest x-ray. I am hopeful that this is just residual inflammation and that this may not be necessary if the prednisone works as it should.    In addition you may use A non-prescription cough  medication called Mucinex DM: take 2 tablets every 12 hours. and A prescription cough medication called Tessalon Perles . You may take 1-2 capsules every 8 hours as needed for your cough.  Sterapred 10 mg dosepak  USE OF BRONCHODILATOR ("RESCUE") INHALERS: There is a risk from using your bronchodilator too frequently.  The risk is that over-reliance on a medication which only relaxes the muscles surrounding the breathing tubes can reduce the effectiveness of medications prescribed to reduce swelling and congestion of the tubes themselves.  Although you feel brief relief from the bronchodilator inhaler, your asthma may actually be worsening with the tubes becoming more swollen and filled with mucus.  This can delay other crucial treatments, such as oral steroid medications. If you need to use a bronchodilator inhaler daily, several times per day, you should discuss this with your provider.  There are probably better treatments that could be used to keep your asthma under control.     HOME CARE . Only take medications as instructed by your medical team. . Complete the entire course of an antibiotic. . Drink plenty of fluids and get plenty of rest. . Avoid close contacts especially the very young and the elderly . Cover your mouth if you cough or cough into your sleeve. . Always remember to wash your hands . A steam or ultrasonic humidifier can help congestion.   GET HELP RIGHT AWAY IF: . You develop worsening fever. . You become short of breath . You cough up blood. . Your symptoms persist after you have completed  your treatment plan MAKE SURE YOU   Understand these instructions.  Will watch your condition.  Will get help right away if you are not doing well or get worse.  Your e-visit answers were reviewed by a board certified advanced clinical practitioner to complete your personal care plan.  Depending on the condition, your plan could have included both over the counter or  prescription medications. If there is a problem please reply  once you have received a response from your provider. Your safety is important to [place].  If you have drug allergies check your prescription carefully.    You can use MyChart to ask questions about today's visit, request a non-urgent call back, or ask for a work or school excuse for 24 hours related to this e-Visit. If it has been greater than 24 hours you will need to follow up with your provider, or enter a new e-Visit to address those concerns. You will get an e-mail in the next two days asking about your experience.  I hope that your e-visit has been valuable and will speed your recovery. Thank you for using e-visits.

## 2016-12-27 ENCOUNTER — Encounter: Payer: Self-pay | Admitting: Internal Medicine

## 2016-12-27 ENCOUNTER — Ambulatory Visit (INDEPENDENT_AMBULATORY_CARE_PROVIDER_SITE_OTHER): Payer: 59 | Admitting: Internal Medicine

## 2016-12-27 VITALS — BP 102/80 | HR 88 | Ht 66.5 in | Wt 147.0 lb

## 2016-12-27 DIAGNOSIS — R1031 Right lower quadrant pain: Secondary | ICD-10-CM | POA: Diagnosis not present

## 2016-12-27 DIAGNOSIS — G8929 Other chronic pain: Secondary | ICD-10-CM

## 2016-12-27 DIAGNOSIS — E890 Postprocedural hypothyroidism: Secondary | ICD-10-CM | POA: Insufficient documentation

## 2016-12-27 DIAGNOSIS — R1310 Dysphagia, unspecified: Secondary | ICD-10-CM | POA: Diagnosis not present

## 2016-12-27 DIAGNOSIS — R197 Diarrhea, unspecified: Secondary | ICD-10-CM

## 2016-12-27 DIAGNOSIS — L501 Idiopathic urticaria: Secondary | ICD-10-CM | POA: Diagnosis not present

## 2016-12-27 DIAGNOSIS — R1319 Other dysphagia: Secondary | ICD-10-CM

## 2016-12-27 MED ORDER — RANITIDINE HCL 300 MG PO TABS: ORAL_TABLET | ORAL | 5 refills | Status: DC

## 2016-12-27 MED FILL — raNITIdine HCL 300 MG TABS: 300 | 30 days supply | Qty: 30 | Fill #0

## 2016-12-27 NOTE — Patient Instructions (Addendum)
If you are age 49 or older, your body mass index should be between 23-30. Your Body mass index is 23.37 kg/m. If this is out of the aforementioned range listed, please consider follow up with your Primary Care Provider.  If you are age 18 or younger, your body mass index should be between 19-25. Your Body mass index is 23.37 kg/m. If this is out of the aformentioned range listed, please consider follow up with your Primary Care Provider.   You have been scheduled for an endoscopy and colonoscopy. Please follow the written instructions given to you at your visit today. Please pick up your prep supplies at the pharmacy. If you use inhalers (even only as needed), please bring them with you on the day of your procedure. Your physician has requested that you go to www.startemmi.com and enter the access code given to you at your visit today. This web site gives a general overview about your procedure. However, you should still follow specific instructions given to you by our office regarding your preparation for the procedure.  Thank you for choosing me and [facility].  Iva Boop, M.D., [facility]

## 2016-12-27 NOTE — Progress Notes (Addendum)
Shelly James 51 y.o. 03-07-1966 [id number] Referred by: Julio Sicks, [facility]  Assessment & Plan:   Encounter Diagnoses  Name Primary?  . Esophageal dysphagia Yes  . Diarrhea, unspecified type   . Chronic RLQ pain   . Chronic idiopathic urticaria     Chronic intermittent sxs in setting of urticaria problems Plan to evaluate for IBD, eosinophilic esophagitis/gastritis/enteritis or other mucosal abnormalities that could be related.Autoimmune diseases are increased in the setting of urticaria. Gastrointestinal complaints are associated with this disorder as well. The risks and benefits as well as alternatives of endoscopic procedure(s) have been discussed and reviewed. All questions answered. The patient agrees to proceed.  Cc: Julio Sicks, [facility] Dr. Laurette Schimke Julian Hy, MD   Subjective:   Chief Complaint: Reflux, abdominal pain and hives  HPI The patient is a very nice married 51 year old white woman with a chronic history of gastrointestinal complaints. He is here because last fall she had right lower quadrant abdominal pain off and on that required her to leave work some. Also about every 8 weeks she has episodes of diarrhea, this usually occurs in the mornings, it is severe and associated with sweats and anxiety sensation.She does not have rectal bleeding. In between she generally feels well. So far those diarrhea episodes have been in the morning and at home and not cause to problems elsewhere. He saw gynecology and was referred here. Prior to that she was referred but then felt better and did not follow through, sent by Dr. Lucie Leather. He has seen her for this urticaria problem and she is treated with histamine 1 and histamine 2 blockers, which seemed to help. Ranitidine also helps reflux symptoms She is also has intermittent mild dysphagia to solid foods and some other types of foods. She says when she had her allergy testing there was nothing specific though she did  have some sort of a reaction. She has been checked for celiac disease and that is negative. H. pylori breath test, Giardia over parasite screening negative. 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 She says her TSH levels are stable and well managed. He does recall having diarrhea problems and got issues when she was hyperthyroid. She does not have periods or at least not significant ones at this point. She does take birth control pills. Thus no clear association with menses  One to 2 caffeinated beverages daily. Regular exercise are. Overall generally feels healthy except for these urticaria problems in the GI symptoms.  Allergies  Allergen Reactions  . Hydrocodone Hives   Current Meds  Medication Sig  . cetirizine (ZYRTEC) 10 MG tablet Take 10 mg by mouth daily.  Marland Kitchen levothyroxine (SYNTHROID, LEVOTHROID) 112 MCG tablet Take 112 mcg by mouth daily.  . norethindrone-ethinyl estradiol (JUNEL FE,GILDESS FE,LOESTRIN FE) 1-20 MG-MCG tablet Take 1 tablet by mouth daily.  . predniSONE (DELTASONE) 10 MG tablet Take 1 tablet (10 mg total) by mouth as directed. Generic sterapred  dose pack 21-taper  . ranitidine (ZANTAC) 300 MG tablet Take one tablet once daily   Past Medical History:  Diagnosis Date  . Allergy   . Asthma   . Graves disease   . Hypothyroidism, postablative   . Urticaria    Past Surgical History:  Procedure Laterality Date  . LEEP    . TONSILLECTOMY AND ADENOIDECTOMY    . wisdom  Social History   Social History  . Marital status: Married    Spouse name: N/A  . Number of children: 2  . Years of education: N/A   Occupational History  . Philanthropathy manager    Social History Main Topics  . Smoking status: Never Smoker  . Smokeless tobacco: Never Used  . Alcohol use Yes  . Drug use: No  .  Sexual activity: Yes    Partners: Male    Birth control/ protection: Pill   Other Topics Concern  . 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  Review of Systems Early finishing a short taper of prednisone for cough after she was treated with Augmentin for sinusitis at urgent care . In early March she was initially prescribed or recommended taking over-the-counter allergy medication for cough and cough medicine  Also has some eczema in the webs of her hands off and on. I think it just in the right hand. All other review of systems are negative Objective:   Physical Exam  102/80   Pulse 88   Ht 5' 6.5" (1.689 m)   Wt 147 lb (66.7 kg)   BMI 23.37 kg/m @  General:  Well-developed, well-nourished and in no acute distress Eyes:  anicteric. ENT:   Mouth and posterior pharynx free of lesions.  Neck:   supple w/o thyromegaly or mass.  Lungs: Clear to auscultation bilaterally. Heart:  S1S2, no rubs, murmurs, gallops. Abdomen:  soft, non-tender, no hepatosplenomegaly, hernia, or mass and BS+.  Rectal: Deferred until colonoscopy Lymph:  no cervical or supraclavicular adenopathy. Extremities:   no edema, cyanosis or clubbing Skin   no rash. Neuro:  A&O x 3.  Psych:  appropriate mood and  Affect.   Data Reviewed: Urgent care notes, E visit notes, allergy notes, gynecology notes, 2017-2018 Tabs as per history of present illness

## 2017-01-23 MED FILL — SYNTHROID 112 MCG TABLET: 112 | 90 days supply | Qty: 90 | Fill #3

## 2017-01-24 ENCOUNTER — Encounter: Payer: Self-pay | Admitting: Internal Medicine

## 2017-01-30 ENCOUNTER — Encounter: Payer: 59 | Admitting: Gastroenterology

## 2017-01-30 MED FILL — raNITIdine HCL 300 MG TABS: 300 | 30 days supply | Qty: 30 | Fill #1

## 2017-01-31 MED FILL — NORETHIND-ETH ESTRAD 1-0.02: 1-20 | 84 days supply | Qty: 63 | Fill #1

## 2017-02-07 ENCOUNTER — Ambulatory Visit (AMBULATORY_SURGERY_CENTER): Payer: 59 | Admitting: Internal Medicine

## 2017-02-07 ENCOUNTER — Encounter: Payer: Self-pay | Admitting: Internal Medicine

## 2017-02-07 VITALS — BP 133/85 | HR 83 | Temp 98.0°F | Resp 18 | Ht 66.0 in | Wt 147.0 lb

## 2017-02-07 DIAGNOSIS — K229 Disease of esophagus, unspecified: Secondary | ICD-10-CM

## 2017-02-07 DIAGNOSIS — R1310 Dysphagia, unspecified: Secondary | ICD-10-CM | POA: Diagnosis present

## 2017-02-07 DIAGNOSIS — R197 Diarrhea, unspecified: Secondary | ICD-10-CM

## 2017-02-07 DIAGNOSIS — E039 Hypothyroidism, unspecified: Secondary | ICD-10-CM | POA: Diagnosis not present

## 2017-02-07 MED ORDER — SODIUM CHLORIDE 0.9 % IV SOLN: 500.0000 mL | INTRAVENOUS | Status: DC

## 2017-02-07 NOTE — Op Note (Signed)
[facility] Patient Name: Shelly James Procedure Date: 02/07/2017 1:27 PM MRN: [id number] Endoscopist: Iva Booparl E Gessner , MD Age: [age] Referring MD:  Date of Birth: 07/30/1966 Gender: Female Account #: [id number] Procedure:                Upper GI endoscopy Indications:              Dysphagia Medicines:                Propofol per Anesthesia, Monitored Anesthesia Care Procedure:                Pre-Anesthesia Assessment:                           - Prior to the procedure, a History and Physical                            was performed, and patient medications and                            allergies were reviewed. The patient's tolerance of                            previous anesthesia was also reviewed. The risks                            and benefits of the procedure and the sedation                            options and risks were discussed with the patient.                            All questions were answered, and informed consent                            was obtained. Prior Anticoagulants: The patient has                            taken no previous anticoagulant or antiplatelet                            agents. ASA Grade Assessment: II - A patient with                            mild systemic disease. After reviewing the risks                            and benefits, the patient was deemed in                            satisfactory condition to undergo the procedure.                           - Prior to the procedure, a History and Physical  was performed, and patient medications and                            allergies were reviewed. The patient's tolerance of                            previous anesthesia was also reviewed. The risks                            and benefits of the procedure and the sedation                            options and risks were discussed with the patient.                            All questions were  answered, and informed consent                            was obtained. Prior Anticoagulants: The patient has                            taken no previous anticoagulant or antiplatelet                            agents. ASA Grade Assessment: II - A patient with                            mild systemic disease. After reviewing the risks                            and benefits, the patient was deemed in                            satisfactory condition to undergo the procedure.                           After obtaining informed consent, the endoscope was                            passed under direct vision. Throughout the                            procedure, the patient's blood pressure, pulse, and                            oxygen saturations were monitored continuously. The                            Endoscope was introduced through the mouth, and                            advanced to the second part of duodenum. The upper  GI endoscopy was accomplished without difficulty.                            The patient tolerated the procedure well. Scope In: Scope Out: Findings:                 Mucosal changes including longitudinal furrows and                            white plaques were found in the upper third of the                            esophagus and in the middle third of the esophagus.                            Biopsies were obtained from the proximal and distal                            esophagus with cold forceps for histology of                            suspected eosinophilic esophagitis. Verification of                            patient identification for the specimen was done.                            Estimated blood loss was minimal. The scope was                            withdrawn. Dilation was performed with a Maloney                            dilator with no resistance at 54 Fr. Estimated                            blood loss: none.                            The exam was otherwise without abnormality.                           The cardia and gastric fundus were normal on                            retroflexion. Complications:            No immediate complications. Estimated Blood Loss:     Estimated blood loss was minimal. Impression:               - Esophageal mucosal changes suspicious for                            eosinophilic esophagitis. Biopsied. Dilated.                           -  The examination was otherwise normal. Recommendation:           - Patient has a contact number available for                            emergencies. The signs and symptoms of potential                            delayed complications were discussed with the                            patient. Return to normal activities tomorrow.                            Written discharge instructions were provided to the                            patient.                           - Clear liquids x 1 hour then soft foods rest of                            day. Start prior diet tomorrow.                           - Continue present medications.                           - Await pathology results. Iva Boop, MD 02/07/2017 2:05:50 PM This report has been signed electronically.

## 2017-02-07 NOTE — Progress Notes (Signed)
Report to PACU, RN, vss, BBS= Clear.  

## 2017-02-07 NOTE — Op Note (Signed)
[facility] Patient Name: Shelly BartersMichelle James Procedure Date: 02/07/2017 1:26 PM MRN: [id number] Endoscopist: Iva Booparl E Sriram Febles , MD Age: [age] Referring MD:  Date of Birth: 01/07/1966 Gender: Female Account #: [id number] Procedure:                Colonoscopy Indications:              Clinically significant diarrhea of unexplained                            origin Medicines:                Propofol per Anesthesia, Monitored Anesthesia Care Procedure:                Pre-Anesthesia Assessment:                           - Prior to the procedure, a History and Physical                            was performed, and patient medications and                            allergies were reviewed. The patient's tolerance of                            previous anesthesia was also reviewed. The risks                            and benefits of the procedure and the sedation                            options and risks were discussed with the patient.                            All questions were answered, and informed consent                            was obtained. Prior Anticoagulants: The patient has                            taken no previous anticoagulant or antiplatelet                            agents. ASA Grade Assessment: II - A patient with                            mild systemic disease. After reviewing the risks                            and benefits, the patient was deemed in                            satisfactory condition to undergo the procedure.  After obtaining informed consent, the colonoscope                            was passed under direct vision. Throughout the                            procedure, the patient's blood pressure, pulse, and                            oxygen saturations were monitored continuously. The                            Model CF-HQ190L [id number]) scope was introduced                            through the anus and  advanced to the the terminal                            ileum, with identification of the appendiceal                            orifice and IC valve. The colonoscopy was performed                            without difficulty. The patient tolerated the                            procedure well. The quality of the bowel                            preparation was excellent. The bowel preparation                            used was Miralax. The terminal ileum, ileocecal                            valve, appendiceal orifice, and rectum were                            photographed. Scope In: 1:38:16 PM Scope Out: 1:52:04 PM Scope Withdrawal Time: 0 hours 8 minutes 58 seconds  Total Procedure Duration: 0 hours 13 minutes 48 seconds  Findings:                 The perianal and digital rectal examinations were                            normal.                           The terminal ileum appeared normal.                           The entire examined colon appeared normal on direct  and retroflexion views.                           Biopsies for histology were taken with a cold                            forceps from the right colon and left colon for                            evaluation of microscopic colitis. Estimated blood                            loss was minimal.                           The exam was otherwise without abnormality on                            direct and retroflexion views. Complications:            No immediate complications. Estimated Blood Loss:     Estimated blood loss was minimal. Impression:               - The examined portion of the ileum was normal.                           - The entire examined colon is normal on direct and                            retroflexion views.                           - The examination was otherwise normal on direct                            and retroflexion views.                           - Biopsies  were taken with a cold forceps from the                            right colon and left colon for evaluation of                            microscopic colitis. Recommendation:           - Patient has a contact number available for                            emergencies. The signs and symptoms of potential                            delayed complications were discussed with the                            patient. Return to normal activities tomorrow.  Written discharge instructions were provided to the                            patient.                           - Clear liquids x 1 hour then soft foods rest of                            day. Start prior diet tomorrow.                           - Continue present medications.                           - Await pathology results.                           - Repeat colonoscopy is recommended. The                            colonoscopy date will be determined after pathology                            results from today's exam become available for                            review. Iva Boop, MD 02/07/2017 2:01:50 PM This report has been signed electronically.

## 2017-02-07 NOTE — Patient Instructions (Addendum)
There were some changes in the esophagus that were suggestive of eosinophilic esophagitis.  I took biopsies and dilated the esophagus. Waiting on the biopsies to see if you have this.  In eosinophilic esophagitis (e-[id number]-sin-[id number]-FILL-ik uh-sof-uh-JIE-tis), a type of white blood cell (eosinophil) builds up in the lining of the tube that connects your mouth to your stomach (esophagus). This buildup, which is a reaction to foods, allergens or acid reflux, can inflame or injure the esophageal tissue. Damaged esophageal tissue can lead to difficulty swallowing or cause food to get caught when you swallow.  Eosinophilic esophagitis is a chronic immune system disease. It has been identified only in the past two decades, but is now considered a major cause of digestive system (gastrointestinal) illness. Research is ongoing and will likely lead to revisions in its diagnosis and treatment.   The colonoscopy and remainder of the upper endoscopy were normal. Colon biopsies were taken to look for any microscopic inflammation that could be related to the diarrhea problems.  I will contact you with results and recommendations.  I appreciate the opportunity to care for you. Iva Boop, MD, FACG  YOU HAD AN ENDOSCOPIC PROCEDURE TODAY AT THE [facility]:   Refer to the procedure report that was given to you for any specific questions about what was found during the examination.  If the procedure report does not answer your questions, please call your gastroenterologist to clarify.  If you requested that your care partner not be given the details of your procedure findings, then the procedure report has been included in a sealed envelope for you to review at your convenience later.  YOU SHOULD EXPECT: Some feelings of bloating in the abdomen. Passage of more gas than usual.  Walking can help get rid of the air that was put into your GI tract during the procedure and reduce the bloating. If you  had a lower endoscopy (such as a colonoscopy or flexible sigmoidoscopy) you may notice spotting of blood in your stool or on the toilet paper. If you underwent a bowel prep for your procedure, you may not have a normal bowel movement for a few days.  Please Note:  You might notice some irritation and congestion in your nose or some drainage.  This is from the oxygen used during your procedure.  There is no need for concern and it should clear up in a day or so.  SYMPTOMS TO REPORT IMMEDIATELY:   Following lower endoscopy (colonoscopy or flexible sigmoidoscopy):  Excessive amounts of blood in the stool  Significant tenderness or worsening of abdominal pains  Swelling of the abdomen that is new, acute  Fever of 100F or higher   Following upper endoscopy (EGD)  Vomiting of blood or coffee ground material  New chest pain or pain under the shoulder blades  Painful or persistently difficult swallowing  New shortness of breath  Fever of 100F or higher  Black, tarry-looking stools  For urgent or emergent issues, a gastroenterologist can be reached at any hour by calling [phone].   DIET:  We do recommend a small meal at first, but then you may proceed to your regular diet.  Drink plenty of fluids but you should avoid alcoholic beverages for 24 hours.  ACTIVITY:  You should plan to take it easy for the rest of today and you should NOT DRIVE or use heavy machinery until tomorrow (because of the sedation medicines used during the test).    FOLLOW UP: Our staff  will call the number listed on your records the next business day following your procedure to check on you and address any questions or concerns that you may have regarding the information given to you following your procedure. If we do not reach you, we will leave a message.  However, if you are feeling well and you are not experiencing any problems, there is no need to return our call.  We will assume that you have returned to your  regular daily activities without incident.  If any biopsies were taken you will be contacted by phone or by letter within the next 1-3 weeks.  Please call us at [phone] if you have not heard about the biopsies in 3 weeks.    SIGNATURES/CONFIDENTIALITY: You and/or your care partner have signed paperwork which will be entered into your electronic medical record.  These signatures attest to the fact that that the information above on your After Visit Summary has been reviewed and is understood.  Full responsibility of the confidentiality of this discharge information lies with you and/or your care-partner.  Post dilation diet given with instructions.

## 2017-02-07 NOTE — Progress Notes (Signed)
Called to room to assist during endoscopic procedure.  Patient ID and intended procedure confirmed with present staff. Received instructions for my participation in the procedure from the performing physician.  

## 2017-02-08 ENCOUNTER — Telehealth: Payer: Self-pay

## 2017-02-08 ENCOUNTER — Telehealth: Payer: Self-pay | Admitting: *Deleted

## 2017-02-08 NOTE — Telephone Encounter (Signed)
Name identifier, left message. 

## 2017-02-08 NOTE — Telephone Encounter (Signed)
  Follow up Call-  Call back number 02/07/2017  Post procedure Call Back phone  # [phone]  Permission to leave phone message Yes  Some recent data might be hidden     No answer, left message.

## 2017-02-15 ENCOUNTER — Encounter: Payer: Self-pay | Admitting: Internal Medicine

## 2017-02-15 NOTE — Progress Notes (Signed)
Esophageal and colon bxs NL - dysphagia and episodic diarrhea sxs Colon recall 10 yrs F/U Dr. Lucie LeatherKozlow and also me - August Try Bendryl if gets diarrhea

## 2017-02-15 NOTE — Progress Notes (Signed)
My Chart letter to patient 10 year colon recall

## 2017-03-24 MED FILL — raNITIdine HCL 300 MG TABS: 300 | 30 days supply | Qty: 30 | Fill #2

## 2017-04-21 MED FILL — SYNTHROID 112 MCG TABLET: 112 | 84 days supply | Qty: 84 | Fill #4

## 2017-04-21 MED FILL — raNITIdine HCL 300 MG TABS: 300 | 30 days supply | Qty: 30 | Fill #3

## 2017-04-24 MED FILL — NORETHIND-ETH ESTRAD 1-0.02: 1-20 | 84 days supply | Qty: 63 | Fill #2

## 2017-05-19 DIAGNOSIS — Z0000 Encounter for general adult medical examination without abnormal findings: Secondary | ICD-10-CM | POA: Diagnosis not present

## 2017-05-19 DIAGNOSIS — E038 Other specified hypothyroidism: Secondary | ICD-10-CM | POA: Diagnosis not present

## 2017-05-19 LAB — LIPID PANEL
Cholesterol: 195 (ref 0–200)
HDL: 60 (ref 35–70)
LDL Cholesterol: 108
LDL/HDL RATIO: 1.8
TRIGLYCERIDES: 133 (ref 40–160)

## 2017-05-19 LAB — CBC AND DIFFERENTIAL
HCT: 43 (ref 36–46)
HEMOGLOBIN: 14.5 (ref 12.0–16.0)
Neutrophils Absolute: 9
Platelets: 346 (ref 150–399)
WBC: 11.6

## 2017-05-19 LAB — HEPATIC FUNCTION PANEL
ALT: 15 (ref 7–35)
AST: 14 (ref 13–35)
Alkaline Phosphatase: 65 (ref 25–125)
BILIRUBIN, TOTAL: 0.8

## 2017-05-19 LAB — BASIC METABOLIC PANEL
BUN: 11 (ref 4–21)
CREATININE: 0.8 (ref 0.5–1.1)
Glucose: 102
POTASSIUM: 4.9 (ref 3.4–5.3)
Sodium: 139 (ref 137–147)

## 2017-05-19 LAB — TSH: TSH: 0.48 (ref 0.41–5.90)

## 2017-05-25 DIAGNOSIS — L508 Other urticaria: Secondary | ICD-10-CM | POA: Diagnosis not present

## 2017-05-25 DIAGNOSIS — N6019 Diffuse cystic mastopathy of unspecified breast: Secondary | ICD-10-CM | POA: Diagnosis not present

## 2017-05-25 DIAGNOSIS — G43809 Other migraine, not intractable, without status migrainosus: Secondary | ICD-10-CM | POA: Diagnosis not present

## 2017-05-25 DIAGNOSIS — Z0000 Encounter for general adult medical examination without abnormal findings: Secondary | ICD-10-CM | POA: Diagnosis not present

## 2017-05-25 DIAGNOSIS — E781 Pure hyperglyceridemia: Secondary | ICD-10-CM | POA: Diagnosis not present

## 2017-05-25 DIAGNOSIS — K589 Irritable bowel syndrome without diarrhea: Secondary | ICD-10-CM | POA: Diagnosis not present

## 2017-05-25 DIAGNOSIS — E038 Other specified hypothyroidism: Secondary | ICD-10-CM | POA: Diagnosis not present

## 2017-05-25 DIAGNOSIS — Z6823 Body mass index (BMI) 23.0-23.9, adult: Secondary | ICD-10-CM | POA: Diagnosis not present

## 2017-05-26 MED FILL — raNITIdine HCL 300 MG TABS: 300 | 30 days supply | Qty: 30 | Fill #4

## 2017-06-15 MED FILL — FLUCONAZOLE 150 MG TABLET: 150 | 1 days supply | Qty: 1 | Fill #0

## 2017-06-26 MED FILL — raNITIdine HCL 300 MG TABS: 300 | 30 days supply | Qty: 30 | Fill #5

## 2017-07-17 MED FILL — SYNTHROID 112 MCG TABLET: 112 | 90 days supply | Qty: 90 | Fill #0

## 2017-07-18 MED FILL — NORETHIND-ETH ESTRAD 1-0.02: 1-20 | 84 days supply | Qty: 63 | Fill #3

## 2017-07-26 ENCOUNTER — Other Ambulatory Visit: Payer: Self-pay | Admitting: Internal Medicine

## 2017-07-26 MED FILL — raNITIdine HCL 300 MG TABS: 300 | 30 days supply | Qty: 30 | Fill #0

## 2017-08-14 ENCOUNTER — Other Ambulatory Visit: Payer: Self-pay | Admitting: Obstetrics & Gynecology

## 2017-08-14 DIAGNOSIS — Z139 Encounter for screening, unspecified: Secondary | ICD-10-CM

## 2017-08-25 MED FILL — raNITIdine HCL 300 MG TABS: 300 | 30 days supply | Qty: 30 | Fill #1

## 2017-09-14 ENCOUNTER — Ambulatory Visit
Admission: RE | Admit: 2017-09-14 | Discharge: 2017-09-14 | Disposition: A | Discharge status: Home / Self Care | Payer: 59 | Source: Ambulatory Visit | Attending: Obstetrics & Gynecology | Admitting: Obstetrics & Gynecology

## 2017-09-14 DIAGNOSIS — Z139 Encounter for screening, unspecified: Secondary | ICD-10-CM

## 2017-09-14 DIAGNOSIS — Z1231 Encounter for screening mammogram for malignant neoplasm of breast: Secondary | ICD-10-CM | POA: Diagnosis not present

## 2017-09-22 MED FILL — raNITIdine HCL 300 MG TABS: 300 | 30 days supply | Qty: 30 | Fill #2

## 2017-09-26 DIAGNOSIS — H52223 Regular astigmatism, bilateral: Secondary | ICD-10-CM | POA: Diagnosis not present

## 2017-09-26 DIAGNOSIS — H5203 Hypermetropia, bilateral: Secondary | ICD-10-CM | POA: Diagnosis not present

## 2017-09-26 DIAGNOSIS — H524 Presbyopia: Secondary | ICD-10-CM | POA: Diagnosis not present

## 2017-10-04 MED FILL — AZITHROMYCIN 500 MG TABLET: 500 | 3 days supply | Qty: 3 | Fill #0

## 2017-10-10 MED FILL — NORETHIND-ETH ESTRAD 1-0.02: 1-20 | 56 days supply | Qty: 42 | Fill #0

## 2017-10-16 MED FILL — raNITIdine HCL 300 MG TABS: 300 | 30 days supply | Qty: 30 | Fill #3

## 2017-10-16 MED FILL — SYNTHROID 112 MCG TABLET: 112 | 90 days supply | Qty: 90 | Fill #1

## 2017-11-01 ENCOUNTER — Encounter: Payer: Self-pay | Admitting: Obstetrics & Gynecology

## 2017-11-01 ENCOUNTER — Ambulatory Visit (INDEPENDENT_AMBULATORY_CARE_PROVIDER_SITE_OTHER): Payer: 59 | Admitting: Obstetrics & Gynecology

## 2017-11-01 VITALS — BP 114/74 | HR 83 | Ht 67.0 in | Wt 150.0 lb

## 2017-11-01 DIAGNOSIS — Z01419 Encounter for gynecological examination (general) (routine) without abnormal findings: Secondary | ICD-10-CM

## 2017-11-01 DIAGNOSIS — Z0000 Encounter for general adult medical examination without abnormal findings: Secondary | ICD-10-CM

## 2017-11-01 DIAGNOSIS — Z124 Encounter for screening for malignant neoplasm of cervix: Secondary | ICD-10-CM

## 2017-11-01 DIAGNOSIS — Z1151 Encounter for screening for human papillomavirus (HPV): Secondary | ICD-10-CM | POA: Diagnosis not present

## 2017-11-01 MED ORDER — NORETHIN ACE-ETH ESTRAD-FE 1-20 MG-MCG PO TABS: 1.0000 | ORAL_TABLET | Freq: Every day | ORAL | 12 refills | Status: DC

## 2017-11-01 NOTE — Progress Notes (Signed)
Last pap smear a year and a half ago- normal. [place] done 08/2017- normal

## 2017-11-01 NOTE — Addendum Note (Signed)
Addended by: Kathie DikeSOLA, DEANNA J on: 11/01/2017 10:32 AM   Modules accepted: Orders

## 2017-11-01 NOTE — Progress Notes (Signed)
Subjective:    Leeanne RioMichelle E Macy is a 52 y.o. married P2 (sons 6519 and 50 yo)  female who presents for an annual exam. The patient has no complaints today. She needs a refill of her OCPs The patient is sexually active. GYN screening history: last pap: was normal. But she reports a h/o some abnormalities.  The patient wears seatbelts: yes. The patient participates in regular exercise: yes. Has the patient ever been transfused or tattooed?: no. The patient reports that there is not domestic violence in her life.   Menstrual History: OB History    Gravida Para Term Preterm AB Living   2 2 2     2    SAB TAB Ectopic Multiple Live Births                  Menarche age: [age] Patient's last menstrual period was 10/18/2017.    The following portions of the patient's history were reviewed and updated as appropriate: allergies, current medications, past family history, past medical history, past social history, past surgical history and problem list.  Review of Systems Pertinent items are noted in HPI.   Married for 25 years Denies dyspareunia FH- no breast/gyn/colon cancer Had colonoscopy and endoscopy in the last 6 months [facility], up to date Works for [employer] [place] [employer] [place] Had flu vaccine   Objective:    BP 114/74   Pulse 83   Ht 5\' 7"  (1.702 m)   Wt 150 lb (68 kg)   LMP 10/18/2017   BMI 23.49 kg/m   General Appearance:    Alert, cooperative, no distress, appears stated age  Head:    Normocephalic, without obvious abnormality, atraumatic  Eyes:    PERRL, conjunctiva/corneas clear, EOM's intact, fundi    benign, both eyes  Ears:    Normal TM's and external ear canals, both ears  Nose:   Nares normal, septum midline, mucosa normal, no drainage    or sinus tenderness  Throat:   Lips, mucosa, and tongue normal; teeth and gums normal  Neck:   Supple, symmetrical, trachea midline, no adenopathy;    thyroid:  no enlargement/tenderness/nodules; no carotid   bruit or  JVD  Back:     Symmetric, no curvature, ROM normal, no CVA tenderness  Lungs:     Clear to auscultation bilaterally, respirations unlabored  Chest Wall:    No tenderness or deformity   Heart:    Regular rate and rhythm, S1 and S2 normal, no murmur, rub   or gallop  Breast Exam:    No tenderness, masses, or nipple abnormality  Abdomen:     Soft, non-tender, bowel sounds active all four quadrants,    no masses, no organomegaly  Genitalia:    Normal female without lesion, discharge or tenderness, normal size and shape, anteverted, mobile, non-tender, normal adnexal exam      Extremities:   Extremities normal, atraumatic, no cyanosis or edema  Pulses:   2+ and symmetric all extremities  Skin:   Skin color, texture, turgor normal, no rashes or lesions  Lymph nodes:   Cervical, supraclavicular, and axillary nodes normal  Neurologic:   CNII-XII intact, normal strength, sensation and reflexes    throughout  .    Assessment:    Healthy female exam.    Plan:     Thin prep Pap smear. with cotesting Rec check FSH after a week of pill-free time Refill OCPs

## 2017-11-02 LAB — CYTOLOGY - PAP
Diagnosis: NEGATIVE
HPV: NOT DETECTED

## 2017-11-13 ENCOUNTER — Other Ambulatory Visit: Payer: 59

## 2017-11-13 DIAGNOSIS — Z780 Asymptomatic menopausal state: Secondary | ICD-10-CM

## 2017-11-13 DIAGNOSIS — Z01419 Encounter for gynecological examination (general) (routine) without abnormal findings: Secondary | ICD-10-CM | POA: Diagnosis not present

## 2017-11-14 LAB — FOLLICLE STIMULATING HORMONE: FSH: 54.3 m[IU]/mL

## 2017-11-21 MED FILL — raNITIdine HCL 300 MG TABS: 300 | 30 days supply | Qty: 30 | Fill #4

## 2017-12-20 MED FILL — raNITIdine HCL 300 MG TABS: 300 | 30 days supply | Qty: 30 | Fill #5

## 2018-01-11 MED FILL — SYNTHROID 112 MCG TABLET: 112 | 90 days supply | Qty: 90 | Fill #2

## 2018-01-17 ENCOUNTER — Other Ambulatory Visit: Payer: Self-pay | Admitting: Internal Medicine

## 2018-01-17 MED FILL — raNITIdine HCL 300 MG TABS: 300 | 30 days supply | Qty: 30 | Fill #0

## 2018-01-23 ENCOUNTER — Encounter: Payer: Self-pay | Admitting: Family Medicine

## 2018-01-23 ENCOUNTER — Ambulatory Visit (INDEPENDENT_AMBULATORY_CARE_PROVIDER_SITE_OTHER): Payer: Self-pay | Admitting: Family Medicine

## 2018-01-23 VITALS — BP 104/70 | HR 91 | Temp 99.0°F | Wt 144.0 lb

## 2018-01-23 DIAGNOSIS — J020 Streptococcal pharyngitis: Secondary | ICD-10-CM

## 2018-01-23 DIAGNOSIS — Z20818 Contact with and (suspected) exposure to other bacterial communicable diseases: Secondary | ICD-10-CM

## 2018-01-23 DIAGNOSIS — J029 Acute pharyngitis, unspecified: Secondary | ICD-10-CM

## 2018-01-23 MED ORDER — AMOXICILLIN 875 MG PO TABS: 875.0000 mg | ORAL_TABLET | Freq: Two times a day (BID) | ORAL | 0 refills | Status: DC

## 2018-01-23 NOTE — Patient Instructions (Signed)

## 2018-01-23 NOTE — Progress Notes (Signed)
Shelly James is a 52 y.o. female who presents today with concerns of exposure to strep through her son who was recently diagnosed and treated for strep and has a history of frequent strep infections. She reports feeling a sore throat this morning and is currently running a low grade temp of 99. She is concerned due to pending travel. She has not attempted to treat this condition up to this point.  Review of Systems  Constitutional: Negative for chills, fever and malaise/fatigue.  HENT: Positive for sore throat. Negative for congestion, ear discharge, ear pain and sinus pain.   Eyes: Negative.   Respiratory: Negative for cough, sputum production and shortness of breath.   Cardiovascular: Negative.  Negative for chest pain.  Gastrointestinal: Negative for abdominal pain, diarrhea, nausea and vomiting.  Genitourinary: Negative for dysuria, frequency, hematuria and urgency.  Musculoskeletal: Negative for myalgias.  Skin: Negative.   Neurological: Negative for headaches.  Endo/Heme/Allergies: Negative.   Psychiatric/Behavioral: Negative.     O: Vitals:   01/23/18 1831  BP: 104/70  Pulse: 91  Temp: 99 F (37.2 C)  SpO2: 97%     Physical Exam  Constitutional: She is oriented to person, place, and time. Vital signs are normal. She appears well-developed and well-nourished. She is active.  Non-toxic appearance. She does not have a sickly appearance.  HENT:  Head: Normocephalic.  Right Ear: Hearing, tympanic membrane, external ear and ear canal normal.  Left Ear: Hearing, tympanic membrane, external ear and ear canal normal.  Nose: Nose normal.  Mouth/Throat: Uvula is midline and mucous membranes are normal. Posterior oropharyngeal erythema present.  Tonsils absent-pt reported them removed- mild erythema present no exudate observed  Neck: Normal range of motion. Neck supple.  Cardiovascular: Normal rate, regular rhythm, normal heart sounds and normal pulses.  Pulmonary/Chest:  Effort normal and breath sounds normal.  Abdominal: Soft. Bowel sounds are normal.  Musculoskeletal: Normal range of motion.  Lymphadenopathy:       Head (right side): Tonsillar adenopathy present. No submental and no submandibular adenopathy present.       Head (left side): Tonsillar adenopathy present. No submental and no submandibular adenopathy present.    She has cervical adenopathy.  Neurological: She is alert and oriented to person, place, and time.  Psychiatric: She has a normal mood and affect.  Vitals reviewed.    A: 1. Sore throat   2. Strep pharyngitis   3. Exposure to strep throat      P: Discussed watchful waiting technique for next 48 hours- patient agrees.  Exam findings, diagnosis etiology and medication use and indications reviewed with patient. Follow- Up and discharge instructions provided. No emergent/urgent issues found on exam.  Patient verbalized understanding of information provided and agrees with plan of care (POC), all questions answered.  1. Sore throat - POCT rapid strep A  2. Strep pharyngitis - amoxicillin (AMOXIL) 875 MG tablet; Take 1 tablet (875 mg total) by mouth 2 (two) times daily.  3. Exposure to strep throat - amoxicillin (AMOXIL) 875 MG tablet; Take 1 tablet (875 mg total) by mouth 2 (two) times daily.

## 2018-01-24 MED FILL — AMOXICILLIN 875 MG TABLET: 875 | 10 days supply | Qty: 20 | Fill #0

## 2018-01-25 ENCOUNTER — Telehealth: Payer: Self-pay

## 2018-01-25 NOTE — Telephone Encounter (Signed)
I left a message asking the patient to call us back. 

## 2018-02-15 ENCOUNTER — Other Ambulatory Visit: Payer: Self-pay | Admitting: General Practice

## 2018-02-15 ENCOUNTER — Encounter: Payer: Self-pay | Admitting: Family Medicine

## 2018-02-15 ENCOUNTER — Other Ambulatory Visit: Payer: Self-pay

## 2018-02-15 ENCOUNTER — Ambulatory Visit: Payer: 59 | Admitting: Family Medicine

## 2018-02-15 VITALS — BP 100/72 | HR 94 | Temp 98.0°F | Resp 16 | Ht 67.0 in | Wt 144.2 lb

## 2018-02-15 DIAGNOSIS — Z0000 Encounter for general adult medical examination without abnormal findings: Secondary | ICD-10-CM | POA: Insufficient documentation

## 2018-02-15 DIAGNOSIS — E890 Postprocedural hypothyroidism: Secondary | ICD-10-CM

## 2018-02-15 DIAGNOSIS — Z780 Asymptomatic menopausal state: Secondary | ICD-10-CM | POA: Diagnosis not present

## 2018-02-15 LAB — HEPATIC FUNCTION PANEL
ALBUMIN: 4.5 g/dL (ref 3.5–5.2)
ALK PHOS: 68 U/L (ref 39–117)
ALT: 31 U/L (ref 0–35)
AST: 25 U/L (ref 0–37)
Bilirubin, Direct: 0.1 mg/dL (ref 0.0–0.3)
TOTAL PROTEIN: 7.2 g/dL (ref 6.0–8.3)
Total Bilirubin: 0.6 mg/dL (ref 0.2–1.2)

## 2018-02-15 LAB — CBC WITH DIFFERENTIAL/PLATELET
BASOS ABS: 0.1 10*3/uL (ref 0.0–0.1)
Basophils Relative: 0.7 % (ref 0.0–3.0)
EOS ABS: 0.2 10*3/uL (ref 0.0–0.7)
Eosinophils Relative: 3.2 % (ref 0.0–5.0)
HEMATOCRIT: 44.6 % (ref 36.0–46.0)
Hemoglobin: 14.9 g/dL (ref 12.0–15.0)
LYMPHS PCT: 25.5 % (ref 12.0–46.0)
Lymphs Abs: 1.8 10*3/uL (ref 0.7–4.0)
MCHC: 33.5 g/dL (ref 30.0–36.0)
MCV: 91.2 fl (ref 78.0–100.0)
Monocytes Absolute: 0.5 10*3/uL (ref 0.1–1.0)
Monocytes Relative: 6.3 % (ref 3.0–12.0)
NEUTROS ABS: 4.6 10*3/uL (ref 1.4–7.7)
NEUTROS PCT: 64.3 % (ref 43.0–77.0)
PLATELETS: 349 10*3/uL (ref 150.0–400.0)
RBC: 4.89 Mil/uL (ref 3.87–5.11)
RDW: 13 % (ref 11.5–15.5)
WBC: 7.2 10*3/uL (ref 4.0–10.5)

## 2018-02-15 LAB — BASIC METABOLIC PANEL
BUN: 19 mg/dL (ref 6–23)
CHLORIDE: 102 meq/L (ref 96–112)
CO2: 28 meq/L (ref 19–32)
Calcium: 10.1 mg/dL (ref 8.4–10.5)
Creatinine, Ser: 0.78 mg/dL (ref 0.40–1.20)
GFR: 82.99 mL/min (ref >60.00)
Glucose, Bld: 94 mg/dL (ref 70–99)
POTASSIUM: 4.4 meq/L (ref 3.5–5.1)
SODIUM: 139 meq/L (ref 135–145)

## 2018-02-15 LAB — TSH: TSH: 0.02 u[IU]/mL — AB (ref 0.35–4.50)

## 2018-02-15 LAB — LIPID PANEL
CHOL/HDL RATIO: 3
Cholesterol: 209 mg/dL — ABNORMAL HIGH (ref 0–200)
HDL: 63.2 mg/dL (ref >39.00)
LDL CALC: 107 mg/dL — AB (ref 0–99)
NonHDL: 146.12
Triglycerides: 195 mg/dL — ABNORMAL HIGH (ref 0.0–149.0)
VLDL: 39 mg/dL (ref 0.0–40.0)

## 2018-02-15 LAB — VITAMIN D 25 HYDROXY (VIT D DEFICIENCY, FRACTURES): VITD: 27 ng/mL — ABNORMAL LOW (ref 30.00–100.00)

## 2018-02-15 MED ORDER — VITAMIN D (ERGOCALCIFEROL) 1.25 MG (50000 UNIT) PO CAPS: 50000.0000 [IU] | ORAL_CAPSULE | ORAL | 0 refills | Status: DC

## 2018-02-15 MED ORDER — LEVOTHYROXINE SODIUM 100 MCG PO TABS: 100.0000 ug | ORAL_TABLET | Freq: Every day | ORAL | 3 refills | Status: DC

## 2018-02-15 MED FILL — SYNTHROID 100 MCG TABLET: 100 | 30 days supply | Qty: 30 | Fill #0

## 2018-02-15 MED FILL — VIT D2 1.25 MG (50,000 UNIT: 1.25 MG | 84 days supply | Qty: 12 | Fill #0

## 2018-02-15 NOTE — Assessment & Plan Note (Signed)
Pt's PE WNL.  UTD on GYN, colonoscopy.  She is checking on her Tdap status.  Check labs.  Anticipatory guidance provided.

## 2018-02-15 NOTE — Patient Instructions (Signed)
Follow up in 1 year or as needed We'll notify you of your lab results and make any changes if needed Keep up the good work on healthy diet and regular exercise- you look great! Call with any questions or concerns Welcome!  We're glad to have you!!! 

## 2018-02-15 NOTE — Progress Notes (Signed)
   Subjective:    Patient ID: Shelly James, female    DOB: 1966-03-18, 52 y.o.   MRN: [id number]  HPI New to establish.  Previous MD- [place], GYN- Careers information officer ([facility] prior to that)  CPE- no concerns to day w/ exception of hot flashes   Review of Systems Patient reports no vision/ hearing changes, adenopathy,fever, weight change,  persistant/recurrent hoarseness , swallowing issues, chest pain, palpitations, edema, persistant/recurrent cough, hemoptysis, dyspnea (rest/exertional/paroxysmal nocturnal), gastrointestinal bleeding (melena, rectal bleeding), abdominal pain, significant heartburn, bowel changes, GU symptoms (dysuria, hematuria, incontinence), Gyn symptoms (abnormal  bleeding, pain),  syncope, focal weakness, memory loss, numbness & tingling, skin/hair/nail changes, abnormal bruising or bleeding, anxiety, or depression.     Objective:   Physical Exam General Appearance:    Alert, cooperative, no distress, appears stated age  Head:    Normocephalic, without obvious abnormality, atraumatic  Eyes:    PERRL, conjunctiva/corneas clear, EOM's intact, fundi    benign, both eyes  Ears:    Normal TM's and external ear canals, both ears  Nose:   Nares normal, septum midline, mucosa normal, no drainage    or sinus tenderness  Throat:   Lips, mucosa, and tongue normal; teeth and gums normal  Neck:   Supple, symmetrical, trachea midline, no adenopathy;    Thyroid: no enlargement/tenderness/nodules  Back:     Symmetric, no curvature, ROM normal, no CVA tenderness  Lungs:     Clear to auscultation bilaterally, respirations unlabored  Chest Wall:    No tenderness or deformity   Heart:    Regular rate and rhythm, S1 and S2 normal, no murmur, rub   or gallop  Breast Exam:    Deferred to GYN  Abdomen:     Soft, non-tender, bowel sounds active all four quadrants,    no masses, no organomegaly  Genitalia:    Deferred to GYN  Rectal:    Extremities:   Extremities normal,  atraumatic, no cyanosis or edema  Pulses:   2+ and symmetric all extremities  Skin:   Skin color, texture, turgor normal, no rashes or lesions  Lymph nodes:   Cervical, supraclavicular, and axillary nodes normal  Neurologic:   CNII-XII intact, normal strength, sensation and reflexes    throughout          Assessment & Plan:

## 2018-02-15 NOTE — Assessment & Plan Note (Signed)
Chronic problem.  Currently asymptomatic.  Check labs.  Adjust meds prn  

## 2018-02-24 MED FILL — raNITIdine HCL 300 MG TABS: 300 | 30 days supply | Qty: 30 | Fill #1

## 2018-03-23 ENCOUNTER — Other Ambulatory Visit (INDEPENDENT_AMBULATORY_CARE_PROVIDER_SITE_OTHER): Payer: 59

## 2018-03-23 DIAGNOSIS — E890 Postprocedural hypothyroidism: Secondary | ICD-10-CM | POA: Diagnosis not present

## 2018-03-23 LAB — TSH: TSH: 0.02 u[IU]/mL — ABNORMAL LOW (ref 0.35–4.50)

## 2018-03-27 ENCOUNTER — Other Ambulatory Visit: Payer: Self-pay | Admitting: General Practice

## 2018-03-27 ENCOUNTER — Encounter: Payer: Self-pay | Admitting: Family Medicine

## 2018-03-27 DIAGNOSIS — E890 Postprocedural hypothyroidism: Secondary | ICD-10-CM

## 2018-03-27 MED ORDER — LEVOTHYROXINE SODIUM 88 MCG PO TABS: 88.0000 ug | ORAL_TABLET | Freq: Every day | ORAL | 3 refills | Status: DC

## 2018-03-27 MED FILL — SYNTHROID 88 MCG TABLET: 88 | 30 days supply | Qty: 30 | Fill #0

## 2018-03-29 MED FILL — raNITIdine HCL 300 MG TABS: 300 | 30 days supply | Qty: 30 | Fill #2

## 2018-04-09 ENCOUNTER — Encounter: Payer: Self-pay | Admitting: General Practice

## 2018-04-24 MED FILL — raNITIdine HCL 300 MG TABS: 300 | 30 days supply | Qty: 30 | Fill #3

## 2018-04-24 MED FILL — SYNTHROID 88 MCG TABLET: 88 | 30 days supply | Qty: 30 | Fill #1

## 2018-05-22 MED FILL — SYNTHROID 88 MCG TABLET: 88 | 30 days supply | Qty: 30 | Fill #2

## 2018-05-22 MED FILL — raNITIdine HCL 300 MG TABS: 300 | 30 days supply | Qty: 30 | Fill #4

## 2018-06-26 MED FILL — SYNTHROID 88 MCG TABLET: 88 | 30 days supply | Qty: 30 | Fill #3

## 2018-07-04 MED FILL — raNITIdine HCL 300 MG TABS: 300 | 30 days supply | Qty: 30 | Fill #5

## 2018-07-26 ENCOUNTER — Other Ambulatory Visit: Payer: Self-pay | Admitting: Internal Medicine

## 2018-07-26 ENCOUNTER — Other Ambulatory Visit: Payer: Self-pay | Admitting: Family Medicine

## 2018-07-26 MED FILL — SYNTHROID 88 MCG TABLET: 88 | 30 days supply | Qty: 30 | Fill #0

## 2018-07-26 NOTE — Telephone Encounter (Signed)
Sir are we changing patients to pepcid for all zantac refills?

## 2018-07-26 NOTE — Telephone Encounter (Signed)
Yes  New rx x 1 year should be famotidine 40 mg same sig otherwise  Please explain to patient since possible carcinogens (very unlikely but abundance of caution) changing to famotdine

## 2018-07-27 MED ORDER — FAMOTIDINE 40 MG PO TABS: 40.0000 mg | ORAL_TABLET | Freq: Every day | ORAL | 3 refills | Status: DC

## 2018-07-27 MED FILL — FAMOTIDINE 40 MG TABS: 40 | 90 days supply | Qty: 90 | Fill #0

## 2018-07-27 NOTE — Addendum Note (Signed)
Addended by: [place], Tosca Pletz E on: 07/27/2018 10:26 AM   Modules accepted: Orders

## 2018-07-27 NOTE — Telephone Encounter (Signed)
I left patient a detailed message with the medicine change information and why we are doing that. New rx sent in as advised.

## 2018-08-02 ENCOUNTER — Other Ambulatory Visit: Payer: Self-pay | Admitting: Family Medicine

## 2018-08-02 DIAGNOSIS — Z1231 Encounter for screening mammogram for malignant neoplasm of breast: Secondary | ICD-10-CM

## 2018-08-09 ENCOUNTER — Encounter: Payer: Self-pay | Admitting: Family Medicine

## 2018-08-25 MED FILL — SYNTHROID 88 MCG TABLET: 88 | 30 days supply | Qty: 30 | Fill #1

## 2018-09-13 ENCOUNTER — Telehealth: Payer: 59 | Admitting: Family

## 2018-09-13 DIAGNOSIS — J020 Streptococcal pharyngitis: Secondary | ICD-10-CM

## 2018-09-13 MED ORDER — AMOXICILLIN 500 MG PO CAPS: 500.0000 mg | ORAL_CAPSULE | Freq: Two times a day (BID) | ORAL | 0 refills | Status: DC

## 2018-09-13 MED FILL — AMOXICILLIN 500 MG CAPSULE: 500 | 10 days supply | Qty: 20 | Fill #0

## 2018-09-13 NOTE — Progress Notes (Signed)
Thank you for the details you included in the comment boxes. Those details are very helpful in determining the best course of treatment for you and help us to provide the best care. Given the lack of high fever and >2 days, treating this as the flu is not ideal. You indicated this feels like another "cold" to you. Colds are viruses yet can have the same symptoms as bacteria, which we may presume is the case after 5-7 days of worsening symptoms. Taking into account you leaving the country, and your son having active Strep, I am going to prescribe a treatment ideal for both strep AND/OR a bacterial sinus infection. If you have been sick less than 5 days, please give this another 24-48 hours before considering antibiotics (most infections are a virus). If you develop a high fever or white spots in your throat, then you likely acquired the strep from your son and should take the antibiotics immediately. See plan below.  We are sorry that you are not feeling well.  Here is how we plan to help!  Based on what you have shared with me it is likely that you have strep pharyngitis.  Strep pharyngitis is inflammation and infection in the back of the throat.  This is an infection cause by bacteria and is treated with antibiotics.  I have prescribed Amoxicillin 500mg  by mouth twice daily for 10 days. For throat pain, we recommend over the counter oral pain relief medications such as acetaminophen or aspirin, or anti-inflammatory medications such as ibuprofen or naproxen sodium. Topical treatments such as oral throat lozenges or sprays may be used as needed. Strep infections are not as easily transmitted as other respiratory infections, however we still recommend that you avoid close contact with loved ones, especially the very young and elderly.  Remember to wash your hands thoroughly throughout the day as this is the number one way to prevent the spread of infection and wipe down door knobs and counters with  disinfectant.   Home Care:  Only take medications as instructed by your medical team.  Complete the entire course of an antibiotic.  Do not take these medications with alcohol.  A steam or ultrasonic humidifier can help congestion.  You can place a towel over your head and breathe in the steam from hot water coming from a faucet.  Avoid close contacts especially the very young and the elderly.  Cover your mouth when you cough or sneeze.  Always remember to wash your hands.  Get Help Right Away If:  You develop worsening fever or sinus pain.  You develop a severe head ache or visual changes.  Your symptoms persist after you have completed your treatment plan.  Make sure you  Understand these instructions.  Will watch your condition.  Will get help right away if you are not doing well or get worse.  Your e-visit answers were reviewed by a board certified advanced clinical practitioner to complete your personal care plan.  Depending on the condition, your plan could have included both over the counter or prescription medications.  If there is a problem please reply  once you have received a response from your provider.  Your safety is important to us.  If you have drug allergies check your prescription carefully.    You can use MyChart to ask questions about today's visit, request a non-urgent call back, or ask for a work or school excuse for 24 hours related to this e-Visit. If it has been greater  than 24 hours you will need to follow up with your provider, or enter a new e-Visit to address those concerns.  You will get an e-mail in the next two days asking about your experience.  I hope that your e-visit has been valuable and will speed your recovery. Thank you for using e-visits.

## 2018-09-24 MED FILL — SYNTHROID 88 MCG TABLET: 88 | 30 days supply | Qty: 30 | Fill #2

## 2018-10-01 ENCOUNTER — Ambulatory Visit
Admission: RE | Admit: 2018-10-01 | Discharge: 2018-10-01 | Disposition: A | Discharge status: Home / Self Care | Payer: 59 | Source: Ambulatory Visit | Attending: Family Medicine | Admitting: Family Medicine

## 2018-10-01 DIAGNOSIS — Z1231 Encounter for screening mammogram for malignant neoplasm of breast: Secondary | ICD-10-CM | POA: Diagnosis not present

## 2018-10-29 MED FILL — SYNTHROID 88 MCG TABLET: 88 | 30 days supply | Qty: 30 | Fill #3

## 2018-11-19 ENCOUNTER — Other Ambulatory Visit: Payer: Self-pay | Admitting: Internal Medicine

## 2018-11-19 MED FILL — FAMOTIDINE 20 MG TABLET: 20 | 30 days supply | Qty: 60 | Fill #0

## 2018-11-28 ENCOUNTER — Other Ambulatory Visit: Payer: Self-pay | Admitting: Family Medicine

## 2018-11-28 MED FILL — SYNTHROID 88 MCG TABLET: 88 | 30 days supply | Qty: 30 | Fill #0

## 2018-12-03 MED FILL — SYNTHROID 88 MCG TABLET: 88 | 30 days supply | Qty: 30 | Fill #1

## 2018-12-14 ENCOUNTER — Telehealth: Payer: 59 | Admitting: Nurse Practitioner

## 2018-12-14 DIAGNOSIS — L245 Irritant contact dermatitis due to other chemical products: Secondary | ICD-10-CM

## 2018-12-14 MED ORDER — DESOXIMETASONE 0.05 % EX CREA: TOPICAL_CREAM | Freq: Two times a day (BID) | CUTANEOUS | 0 refills | Status: DC

## 2018-12-14 MED ORDER — PREDNISONE 10 MG PO TABS: 10.0000 mg | ORAL_TABLET | Freq: Every day | ORAL | 0 refills | Status: DC

## 2018-12-14 MED FILL — predniSONE 10 MG TABS: 10 | 10 days supply | Qty: 10 | Fill #0

## 2018-12-14 MED FILL — DESOXIMETASONE 0.05% CREAM: 0.05 | 30 days supply | Qty: 60 | Fill #0

## 2018-12-14 NOTE — Progress Notes (Signed)
Meds ordered this encounter  Medications  . predniSONE (DELTASONE) 10 MG tablet    Sig: Take 1 tablet (10 mg total) by mouth daily with breakfast.    Dispense:  10 tablet    Refill:  0    Order Specific Question:   Supervising Provider    Answer:   MILLER, BRIAN [[id number]]  . desoximetasone (TOPICORT) 0.05 % cream    Sig: Apply topically 2 (two) times daily.    Dispense:  30 g    Refill:  0    Order Specific Question:   Supervising Provider    Answer:   Eber Hong [[id number]]

## 2018-12-14 NOTE — Progress Notes (Signed)
E Visit for Rash  We are sorry that you are not feeling well. Here is how we plan to help!  Based on what you shared with me it looks like you have contact dermatitis.  Contact dermatitis is a skin rash caused by something that touches the skin and causes irritation or inflammation.  Your skin may be red, swollen, dry, cracked, and itch.  The rash should go away in a few days but can last a few weeks.  If you get a rash, it's important to figure out what caused it so the irritant can be avoided in the future. and I have prescribed Prednisone 10 mg daily for 5 days     HOME CARE:   Take cool showers and avoid direct sunlight.  Apply cool compress or wet dressings.  Take a bath in an oatmeal bath.  Sprinkle content of one Aveeno packet under running faucet with comfortably warm water.  Bathe for 15-20 minutes, 1-2 times daily.  Pat dry with a towel. Do not rub the rash.  Use hydrocortisone cream.  Take an antihistamine like Benadryl for widespread rashes that itch.  The adult dose of Benadryl is 25-50 mg by mouth 4 times daily.  Caution:  This type of medication may cause sleepiness.  Do not drink alcohol, drive, or operate dangerous machinery while taking antihistamines.  Do not take these medications if you have prostate enlargement.  Read package instructions thoroughly on all medications that you take.  GET HELP RIGHT AWAY IF:   Symptoms don't go away after treatment.  Severe itching that persists.  If you rash spreads or swells.  If you rash begins to smell.  If it blisters and opens or develops a yellow-brown crust.  You develop a fever.  You have a sore throat.  You become short of breath.  MAKE SURE YOU:  Understand these instructions. Will watch your condition. Will get help right away if you are not doing well or get worse.  Thank you for choosing an e-visit. Your e-visit answers were reviewed by a board certified advanced clinical practitioner to complete  your personal care plan. Depending upon the condition, your plan could have included both over the counter or prescription medications. Please review your pharmacy choice. Be sure that the pharmacy you have chosen is open so that you can pick up your prescription now.  If there is a problem you may message your provider in MyChart to have the prescription routed to another pharmacy. Your safety is important to [place]. If you have drug allergies check your prescription carefully.  For the next 24 hours, you can use MyChart to ask questions about today's visit, request a non-urgent call back, or ask for a work or school excuse from your e-visit provider. You will get an email in the next two days asking about your experience. I hope that your e-visit has been valuable and will speed your recovery.   5 minutes spent reviewing and documenting in chart.

## 2018-12-14 NOTE — Addendum Note (Signed)
Addended by: Bennie Pierini on: 12/14/2018 02:45 PM   Modules accepted: Orders

## 2018-12-25 MED FILL — FAMOTIDINE 40 MG TABLET: 40 | 30 days supply | Qty: 30 | Fill #0

## 2019-01-01 MED FILL — SYNTHROID 88 MCG TABLET: 88 | 30 days supply | Qty: 30 | Fill #2

## 2019-01-28 MED FILL — SYNTHROID 88 MCG TABLET: 88 | 30 days supply | Qty: 30 | Fill #3

## 2019-03-01 MED FILL — AMOXICILLIN 500 MG CAPSULE: 500 | 7 days supply | Qty: 21 | Fill #0

## 2019-03-13 ENCOUNTER — Ambulatory Visit (INDEPENDENT_AMBULATORY_CARE_PROVIDER_SITE_OTHER): Payer: Self-pay | Admitting: Physician Assistant

## 2019-03-13 ENCOUNTER — Telehealth: Payer: 59 | Admitting: Family

## 2019-03-13 ENCOUNTER — Other Ambulatory Visit: Payer: Self-pay

## 2019-03-13 VITALS — BP 100/70 | HR 79 | Temp 97.9°F | Resp 16 | Wt 149.8 lb

## 2019-03-13 DIAGNOSIS — S0502XA Injury of conjunctiva and corneal abrasion without foreign body, left eye, initial encounter: Secondary | ICD-10-CM

## 2019-03-13 DIAGNOSIS — H5710 Ocular pain, unspecified eye: Secondary | ICD-10-CM

## 2019-03-13 MED ORDER — ERYTHROMYCIN 5 MG/GM OP OINT: 1.0000 "application " | TOPICAL_OINTMENT | Freq: Four times a day (QID) | OPHTHALMIC | 0 refills | Status: AC

## 2019-03-13 MED FILL — ERYTHROMYCIN 0.5% EYE OINT: 5 | 1 days supply | Qty: 4 | Fill #0

## 2019-03-13 NOTE — Progress Notes (Signed)
MRN: [id number] DOB: 02/15/1966  Subjective:   Shelly James is a 53 y.o. female presenting for chief complaint of Eye Pain (1 day, left eye ) .  Reports 1 day history of left eye irritation. Has foreign body sensation at lateral aspect of eye, discomfort, and some itching. Denies pain with eye movements, photophobia, eye redness, eye discharge, visual disturbance, fever, chills, nausea, vomiting, and headache.  Has tried over-the-counter antihistamine drops, Visine, and flushing her eye with no full relief.  Denies acute eye trauma.  Does report wearing mascara for the first time a few days ago.  Denies contact lens use.  Wears eyeglasses for reading.  She does scratch her eyes pretty frequently due to seasonal allergic conjunctivitis.  Denies PMH of DM, hypertension, glaucoma, and corneal abrasion.  Denies smoking.  Did Evisit today for eye complaint and was encouraged to see provider for face to face visit.   Review of Systems  Constitutional: Negative for malaise/fatigue.  HENT: Negative for congestion, ear pain, sinus pain and sore throat.   Respiratory: Negative for shortness of breath.   Cardiovascular: Negative for chest pain.  Neurological: Negative for weakness.    Shelly James has a current medication list which includes the following prescription(s): cetirizine, desoximetasone, famotidine, synthroid, amoxicillin, prednisone, and vitamin d (ergocalciferol). Also is allergic to hydrocodone.  Shelly James  has a past medical history of Allergy, Asthma, Graves disease, Hypothyroidism, postablative, Urticaria, and Vaginal Pap smear, abnormal. Also  has a past surgical history that includes Tonsillectomy and adenoidectomy; wisdom; and LEEP.   Objective:   Vitals: BP 100/70 (BP Location: Right Arm, Patient Position: Sitting, Cuff Size: Normal)   Pulse 79   Temp 97.9 F (36.6 C) (Oral)   Resp 16   Wt 149 lb 12.8 oz (67.9 kg)   LMP 10/18/2017   SpO2 98%   BMI 23.46 kg/m   Physical  Exam Vitals signs reviewed.  Constitutional:      General: She is not in acute distress.    Appearance: She is not ill-appearing or toxic-appearing.     Comments: Sitting comfortably on exam table.  HENT:     Head: Normocephalic and atraumatic.  Eyes:     General: Lids are normal.        Right eye: No foreign body or discharge.        Left eye: No foreign body or discharge.     Extraocular Movements: Extraocular movements intact.     Conjunctiva/sclera:     Right eye: Right conjunctiva is injected (minmal injection noted throughout conjunctiva).     Left eye: Left conjunctiva is injected (minimal injection noted throughout conjunctiva).     Pupils: Pupils are equal, round, and reactive to light.     Left eye: Fluorescein uptake present. No corneal abrasion. Seidel exam negative.    Funduscopic exam:    Right eye: No AV nicking or papilledema. Red reflex present.        Left eye: No AV nicking or papilledema. Red reflex present.    Visual Fields: Left eye visual fields normal.      Comments: No photophobia noted b/l.  No pain with EOMs.  No pain with palpation, erythema, or edema of periorbital regions b/l.  No hyphema or hypopyon noted.   Neck:     Musculoskeletal: Normal range of motion.  Pulmonary:     Effort: Pulmonary effort is normal.  Skin:    General: Skin is warm and dry.  Neurological:     Mental  Status: She is alert and oriented to person, place, and time.     No results found for this or any previous visit (from the past 24 hour(s)).    Hearing Screening   125Hz  250Hz  500Hz  1000Hz  2000Hz  3000Hz  4000Hz  6000Hz  8000Hz   Right ear:           Left ear:             Visual Acuity Screening   Right eye Left eye Both eyes  Without correction: 20/25 20/40 20/25   With correction:        Assessment and Plan :  1. Abrasion of left conjunctiva, initial encounter Pt is overall well appearing, NAD. VSS. PE reveals + superficial fluorescein uptake of left conjunctiva  consistent with conjunctival abrasion. Uptake is at location where pt feels the foreign body sensation- no foreign body identified. Negative Seidal exam. Minimal injection of b/l conjunctiva-baseline for pt due to seasonal allergies. VA of affected eye slightly lower than right eye but still WNL. Pt denies any visual disturbance. Reassuring that PE reveals PERRLA, no objective photophobia, and no corneal opacity.  Pt does not wear contact lens. Rec tx with erythromycin ointment and close f/u with eye doctor to ensure appropriately response to tx. Encouraged her to seek care immediately at ED if any of your current symptoms worsen with tx plan or she develops new concerning sx discussed in office. Pt voices her understanding and agrees with plan.  - erythromycin ophthalmic ointment; Place 1 application into the left eye 4 (four) times daily for 7 days.  Dispense: 28 g; Refill: [place], [facility] 03/13/2019 3:44 PM

## 2019-03-13 NOTE — Progress Notes (Signed)
Based on what you shared with me, I feel your condition warrants further evaluation and I recommend that you be seen for a face to face office visit.  Given that you are having severe eye pain, I believe that would be best if you were seen face to face to be evaluated.   NOTE: If you entered your credit card information for this eVisit, you will not be charged. You may see a "hold" on your card for the $35 but that hold will drop off and you will not have a charge processed.  If you are having a true medical emergency please call 911.     For an urgent face to face visit, [place] has five urgent care centers for your convenience:    [email or web address] to reserve your spot online an avoid wait times  [facility] [address], [address] [place], [place] [address] Modified hours of operation: Monday-Friday, 12 PM to 6 PM  Closed Saturday & Sunday  *Across the street from [place] (New Address!) [address] [place], [place] [address] *Just off [address], across the road from [place] hours of operation: Monday-Friday, 12 PM to 6 PM  Closed Saturday & Sunday   The following sites will take your insurance:  . [facility]    [phone]                  Get Driving Directions  [address], [place] [address] . 10 am to 8 pm Monday-Friday . 12 pm to 8 pm Saturday-Sunday   . [facility] at [place]                  Get Driving Directions  [address], [place], [place] [address] . 8 am to 8 pm Monday-Friday . 9 am to 6 pm Saturday . 11 am to 6 pm Sunday   . [facility] at [place]                  Get Driving Directions   [address], [place] [address] . 8 am to 8 pm Monday-Friday . 8 am to 4 pm Saturday-Sunday    . [facility]  at [place]                    Get Driving Directions  [phone]  [address], [place], [place] [address]  . Monday-Friday, 12 PM to 6 PM    Your e-visit answers were reviewed by a board certified advanced clinical practitioner to complete your personal care plan.  Thank you for using e-Visits.

## 2019-03-13 NOTE — Patient Instructions (Signed)
You are being treated for a conjunctival abrasion. Please use eye ointment as prescribed. I recommend following up with an eye doctor in 2-3 days. If any symptoms worsen or you develop new concerning symptoms, please seek care at ED.

## 2019-03-15 ENCOUNTER — Telehealth: Payer: Self-pay

## 2019-03-15 NOTE — Telephone Encounter (Signed)
Patient did not answered the phone, I left a message asking to call us back.  

## 2019-03-27 ENCOUNTER — Other Ambulatory Visit: Payer: Self-pay | Admitting: Family Medicine

## 2019-03-28 ENCOUNTER — Other Ambulatory Visit: Payer: Self-pay | Admitting: Family Medicine

## 2019-04-05 ENCOUNTER — Telehealth: Payer: Self-pay | Admitting: *Deleted

## 2019-04-05 NOTE — Telephone Encounter (Signed)
Left patient a message to answer screening questions via MyChart message or call the office.

## 2019-04-08 ENCOUNTER — Other Ambulatory Visit: Payer: Self-pay | Admitting: Family Medicine

## 2019-04-08 ENCOUNTER — Ambulatory Visit: Payer: 59 | Admitting: Obstetrics & Gynecology

## 2019-04-08 MED ORDER — SYNTHROID 88 MCG PO TABS: 88.0000 ug | ORAL_TABLET | Freq: Every day | ORAL | 1 refills | Status: DC

## 2019-04-08 MED FILL — SYNTHROID 88 MCG TABLET: 88 | 30 days supply | Qty: 30 | Fill #0

## 2019-04-08 NOTE — Progress Notes (Signed)
Medication refilled at pt's request °

## 2019-05-02 ENCOUNTER — Telehealth: Payer: Self-pay | Admitting: Family Medicine

## 2019-05-02 NOTE — Telephone Encounter (Signed)
There should be a refill remaining based on what we sent in July.  She should contact her pharmacy

## 2019-05-02 NOTE — Telephone Encounter (Signed)
Please check behind me. Med was filled 04/08/19 #30 with 1 refill to get her through until her appt. She should still have one refill remaining correct?

## 2019-05-02 NOTE — Telephone Encounter (Signed)
Pt called in asking for a refill on the synthroid she has an appt scheduled with Tabori on 05/20/2019 but will be out of her medication on 05/06/2019. Pt uses [facility] outpt pharmacy and can be reached at the home #

## 2019-05-02 NOTE — Telephone Encounter (Signed)
Pt is aware of this.  °

## 2019-05-03 ENCOUNTER — Ambulatory Visit: Payer: 59 | Admitting: Family Medicine

## 2019-05-05 MED FILL — SYNTHROID 88 MCG TABLET: 88 | 30 days supply | Qty: 30 | Fill #1

## 2019-05-20 ENCOUNTER — Encounter: Payer: Self-pay | Admitting: Family Medicine

## 2019-05-20 ENCOUNTER — Other Ambulatory Visit: Payer: Self-pay

## 2019-05-20 ENCOUNTER — Ambulatory Visit: Payer: 59 | Admitting: Family Medicine

## 2019-05-20 VITALS — BP 108/76 | HR 76 | Temp 97.9°F | Resp 16 | Ht 67.0 in | Wt 153.4 lb

## 2019-05-20 DIAGNOSIS — R635 Abnormal weight gain: Secondary | ICD-10-CM | POA: Diagnosis not present

## 2019-05-20 DIAGNOSIS — E890 Postprocedural hypothyroidism: Secondary | ICD-10-CM | POA: Diagnosis not present

## 2019-05-20 DIAGNOSIS — Z23 Encounter for immunization: Secondary | ICD-10-CM

## 2019-05-20 LAB — LIPID PANEL
Cholesterol: 212 mg/dL — ABNORMAL HIGH (ref 0–200)
HDL: 65.1 mg/dL (ref >39.00)
LDL Cholesterol: 113 mg/dL — ABNORMAL HIGH (ref 0–99)
NonHDL: 146.81
Total CHOL/HDL Ratio: 3
Triglycerides: 169 mg/dL — ABNORMAL HIGH (ref 0.0–149.0)
VLDL: 33.8 mg/dL (ref 0.0–40.0)

## 2019-05-20 LAB — CBC WITH DIFFERENTIAL/PLATELET
Basophils Absolute: 0 10*3/uL (ref 0.0–0.1)
Basophils Relative: 0.8 % (ref 0.0–3.0)
Eosinophils Absolute: 0.2 10*3/uL (ref 0.0–0.7)
Eosinophils Relative: 3.5 % (ref 0.0–5.0)
HCT: 38.7 % (ref 36.0–46.0)
Hemoglobin: 12.7 g/dL (ref 12.0–15.0)
Lymphocytes Relative: 34.6 % (ref 12.0–46.0)
Lymphs Abs: 1.7 10*3/uL (ref 0.7–4.0)
MCHC: 32.8 g/dL (ref 30.0–36.0)
MCV: 86 fl (ref 78.0–100.0)
Monocytes Absolute: 0.3 10*3/uL (ref 0.1–1.0)
Monocytes Relative: 6.6 % (ref 3.0–12.0)
Neutro Abs: 2.7 10*3/uL (ref 1.4–7.7)
Neutrophils Relative %: 54.5 % (ref 43.0–77.0)
Platelets: 382 10*3/uL (ref 150.0–400.0)
RBC: 4.5 Mil/uL (ref 3.87–5.11)
RDW: 14.1 % (ref 11.5–15.5)
WBC: 4.9 10*3/uL (ref 4.0–10.5)

## 2019-05-20 LAB — BASIC METABOLIC PANEL
BUN: 14 mg/dL (ref 6–23)
CO2: 27 mEq/L (ref 19–32)
Calcium: 9.7 mg/dL (ref 8.4–10.5)
Chloride: 104 mEq/L (ref 96–112)
Creatinine, Ser: 0.8 mg/dL (ref 0.40–1.20)
GFR: 75.45 mL/min (ref >60.00)
Glucose, Bld: 82 mg/dL (ref 70–99)
Potassium: 4.6 mEq/L (ref 3.5–5.1)
Sodium: 138 mEq/L (ref 135–145)

## 2019-05-20 LAB — HEPATIC FUNCTION PANEL
ALT: 15 U/L (ref 0–35)
AST: 23 U/L (ref 0–37)
Albumin: 4.4 g/dL (ref 3.5–5.2)
Alkaline Phosphatase: 67 U/L (ref 39–117)
Bilirubin, Direct: 0.1 mg/dL (ref 0.0–0.3)
Total Bilirubin: 0.4 mg/dL (ref 0.2–1.2)
Total Protein: 6.9 g/dL (ref 6.0–8.3)

## 2019-05-20 LAB — TSH: TSH: 1.77 u[IU]/mL (ref 0.35–4.50)

## 2019-05-20 NOTE — Assessment & Plan Note (Signed)
Chronic problem.  Mostly asymptomatic.  Some weight gain.  Check labs.  Adjust meds prn

## 2019-05-20 NOTE — Progress Notes (Signed)
   Subjective:    Patient ID: Shelly James, female    DOB: August 09, 1966, 53 y.o.   MRN: [id number]  HPI Hypothyroid- chronic problem, on Synthroid 74mcg daily.  Pt has gained 10 lbs since last year.  'I feel fine'.  Some 'sluggish' feelings.  Denies changes to skin/hair/nails  Weight gain- pt has gained 10 lbs.  Pt feels it's menopausal related.  Not sure if this is thyroid related.  Is now exercising and working with a Physiological scientist.   Review of Systems For ROS see HPI     Objective:   Physical Exam Vitals signs reviewed.  Constitutional:      General: She is not in acute distress.    Appearance: Normal appearance. She is well-developed.  HENT:     Head: Normocephalic and atraumatic.  Eyes:     Conjunctiva/sclera: Conjunctivae normal.     Pupils: Pupils are equal, round, and reactive to light.  Neck:     Musculoskeletal: Normal range of motion and neck supple.     Thyroid: No thyromegaly.  Cardiovascular:     Rate and Rhythm: Normal rate and regular rhythm.     Heart sounds: Normal heart sounds. No murmur.  Pulmonary:     Effort: Pulmonary effort is normal. No respiratory distress.     Breath sounds: Normal breath sounds.  Abdominal:     General: There is no distension.     Palpations: Abdomen is soft.     Tenderness: There is no abdominal tenderness.  Lymphadenopathy:     Cervical: No cervical adenopathy.  Skin:    General: Skin is warm and dry.  Neurological:     Mental Status: She is alert and oriented to person, place, and time.  Psychiatric:        Mood and Affect: Mood normal.        Behavior: Behavior normal.        Thought Content: Thought content normal.           Assessment & Plan:  Weight gain- pt has gained 10 lbs since last visit.  This could be a combination of menopause and hypothyroidism.  Applauded her efforts at exercise.  Check labs to risk stratify.  Will follow.

## 2019-05-20 NOTE — Patient Instructions (Signed)
Schedule your complete physical in 6 months We'll notify you of your lab results and make any changes if needed Keep up the good work on healthy diet and regular exercise- you're doing great! Call with any questions or concerns Stay Safe!! 

## 2019-05-21 ENCOUNTER — Encounter: Payer: Self-pay | Admitting: General Practice

## 2019-06-03 ENCOUNTER — Other Ambulatory Visit: Payer: 59

## 2019-06-03 ENCOUNTER — Other Ambulatory Visit: Payer: Self-pay | Admitting: General Practice

## 2019-06-03 ENCOUNTER — Other Ambulatory Visit: Payer: Self-pay | Admitting: Family Medicine

## 2019-06-03 ENCOUNTER — Other Ambulatory Visit (INDEPENDENT_AMBULATORY_CARE_PROVIDER_SITE_OTHER): Payer: 59

## 2019-06-03 ENCOUNTER — Telehealth: Payer: Self-pay | Admitting: Internal Medicine

## 2019-06-03 ENCOUNTER — Telehealth: Payer: Self-pay | Admitting: Gastroenterology

## 2019-06-03 DIAGNOSIS — R197 Diarrhea, unspecified: Secondary | ICD-10-CM | POA: Diagnosis not present

## 2019-06-03 LAB — COMPREHENSIVE METABOLIC PANEL
ALT: 15 U/L (ref 0–35)
AST: 20 U/L (ref 0–37)
Albumin: 3.9 g/dL (ref 3.5–5.2)
Alkaline Phosphatase: 59 U/L (ref 39–117)
BUN: 10 mg/dL (ref 6–23)
CO2: 24 mEq/L (ref 19–32)
Calcium: 9 mg/dL (ref 8.4–10.5)
Chloride: 100 mEq/L (ref 96–112)
Creatinine, Ser: 0.87 mg/dL (ref 0.40–1.20)
GFR: 68.48 mL/min (ref >60.00)
Glucose, Bld: 97 mg/dL (ref 70–99)
Potassium: 3.4 mEq/L — ABNORMAL LOW (ref 3.5–5.1)
Sodium: 135 mEq/L (ref 135–145)
Total Bilirubin: 0.3 mg/dL (ref 0.2–1.2)
Total Protein: 6.9 g/dL (ref 6.0–8.3)

## 2019-06-03 LAB — CBC
HCT: 37.8 % (ref 36.0–46.0)
Hemoglobin: 12.4 g/dL (ref 12.0–15.0)
MCHC: 32.9 g/dL (ref 30.0–36.0)
MCV: 83.3 fl (ref 78.0–100.0)
Platelets: 318 10*3/uL (ref 150.0–400.0)
RBC: 4.54 Mil/uL (ref 3.87–5.11)
RDW: 13.8 % (ref 11.5–15.5)
WBC: 4.1 10*3/uL (ref 4.0–10.5)

## 2019-06-03 LAB — SEDIMENTATION RATE: Sed Rate: 20 mm/hr (ref 0–30)

## 2019-06-03 MED ORDER — SYNTHROID 88 MCG PO TABS: 88.0000 ug | ORAL_TABLET | Freq: Every day | ORAL | 1 refills | Status: DC

## 2019-06-03 MED ORDER — CIPROFLOXACIN HCL 500 MG PO TABS: 500.0000 mg | ORAL_TABLET | Freq: Two times a day (BID) | ORAL | 0 refills | Status: DC

## 2019-06-03 MED FILL — SYNTHROID 88 MCG TABLET: 88 | 30 days supply | Qty: 30 | Fill #0

## 2019-06-03 MED FILL — CIPROFLOXACIN HCL 500 MG TA: 500 | 3 days supply | Qty: 6 | Fill #0

## 2019-06-03 NOTE — Telephone Encounter (Signed)
She called this morning.  Acute diarrhea for 6-7 days.  Slightly bloody at times.  Mild cramping in abd but no severe pains.  + fevers.  She feels she is getting dehydrated, terrible headache the past day or so.  Taking imodium periocially.    No travel.  No sick contacts, no recent abx.  Likely infectious; she will come by the lab this morning and get cbc, cmet, esr and GI pathogen panel and I'm calling in cipro empiricallly for her to start after she drops of the stool testing.  She knows to push fluids as best that she can, ok to take imodium PRN.  Sheri, Can you see about an appt sometime soon with Dr. Carlean Purl or an extender.   Ed Blalock

## 2019-06-03 NOTE — Telephone Encounter (Signed)
Called to f/u  She is afraid to leave house due to diarrhea  Bad headache overnight is better  She ? Dehydration but had also stopped coffee/caffeine  Had terrible sweats one night so thought febrile but did not take temp  She had a Covid-19 test through [facility] and that is negative though not in chart   Bloody stools now dark  She had negative EGD and colon and celiac tests for recurrent episodic diarrhea in past - these problems much worse, however   Plan - await GI path panel  I think we may need to look at small bowel - consider capsule endo for hematochezia and diarrhea  May want to do at hospital for better views w/ Given's as opposed to office device   Other option would be cross sectional imaging   I am sending her ORS recipes also - suggested tea if not coffee

## 2019-06-03 NOTE — Telephone Encounter (Signed)
Pt called back stating appt scheduled was fine.

## 2019-06-03 NOTE — Telephone Encounter (Signed)
I put her on for Thurs with Tye Savoy RNP and LM for her to call back to confirm

## 2019-06-05 LAB — GASTROINTESTINAL PATHOGEN PANEL PCR
C. difficile Tox A/B, PCR: NOT DETECTED
Campylobacter, PCR: NOT DETECTED
Cryptosporidium, PCR: NOT DETECTED
E coli (ETEC) LT/ST PCR: NOT DETECTED
E coli (STEC) stx1/stx2, PCR: NOT DETECTED
E coli 0157, PCR: NOT DETECTED
Giardia lamblia, PCR: NOT DETECTED
Norovirus, PCR: NOT DETECTED
Rotavirus A, PCR: NOT DETECTED
Salmonella, PCR: DETECTED — AB
Shigella, PCR: NOT DETECTED

## 2019-06-06 ENCOUNTER — Ambulatory Visit: Payer: 59 | Admitting: Nurse Practitioner

## 2019-07-06 MED FILL — SYNTHROID 88 MCG TABLET: 88 | 30 days supply | Qty: 30 | Fill #1

## 2019-07-31 MED FILL — SYNTHROID 88 MCG TABLET: 88 | 30 days supply | Qty: 30 | Fill #2

## 2019-08-15 ENCOUNTER — Telehealth: Payer: 59 | Admitting: Physician Assistant

## 2019-08-15 DIAGNOSIS — L309 Dermatitis, unspecified: Secondary | ICD-10-CM

## 2019-08-15 MED ORDER — BETAMETHASONE DIPROPIONATE AUG 0.05 % EX OINT: TOPICAL_OINTMENT | Freq: Two times a day (BID) | CUTANEOUS | 0 refills | Status: DC

## 2019-08-15 NOTE — Progress Notes (Signed)
E Visit for Rash ECZEMA We are sorry that you are not feeling well. Here is how we plan to help!   Betamethasone Ointment 0.05% Apply BID to affected areas It was sent to the pharmacy listed in your chart.   HOME CARE:   Take cool showers and avoid direct sunlight.  Apply cool compress or wet dressings.  Take a bath in an oatmeal bath.  Sprinkle content of one Aveeno packet under running faucet with comfortably warm water.  Bathe for 15-20 minutes, 1-2 times daily.  Pat dry with a towel. Do not rub the rash.    Take an antihistamine like Benadryl for widespread rashes that itch.  The adult dose of Benadryl is 25-50 mg by mouth 4 times daily.  Caution:  This type of medication may cause sleepiness.  Do not drink alcohol, drive, or operate dangerous machinery while taking antihistamines.  Do not take these medications if you have prostate enlargement.  Read package instructions thoroughly on all medications that you take.  GET HELP RIGHT AWAY IF:   Symptoms don't go away after treatment.  Severe itching that persists.  If you rash spreads or swells.  If you rash begins to smell.  If it blisters and opens or develops a yellow-brown crust.  You develop a fever.  You have a sore throat.  You become short of breath.  MAKE SURE YOU:  Understand these instructions. Will watch your condition. Will get help right away if you are not doing well or get worse.  Thank you for choosing an e-visit. Your e-visit answers were reviewed by a board certified advanced clinical practitioner to complete your personal care plan. Depending upon the condition, your plan could have included both over the counter or prescription medications. Please review your pharmacy choice. Be sure that the pharmacy you have chosen is open so that you can pick up your prescription now.  If there is a problem you may message your provider in [place] to have the prescription routed to another pharmacy. Your  safety is important to [place]. If you have drug allergies check your prescription carefully.  For the next 24 hours, you can use MyChart to ask questions about today's visit, request a non-urgent call back, or ask for a work or school excuse from your e-visit provider. You will get an email in the next two days asking about your experience. I hope that your e-visit has been valuable and will speed your recovery.   Particia Nearing PA-C  Approximately 5 minutes was spent documenting and reviewing patient's chart.

## 2019-09-02 ENCOUNTER — Ambulatory Visit: Payer: 59 | Admitting: Internal Medicine

## 2019-09-05 MED FILL — SYNTHROID 88 MCG TABLET: 88 | 30 days supply | Qty: 30 | Fill #3

## 2019-10-02 ENCOUNTER — Other Ambulatory Visit: Payer: Self-pay

## 2019-10-02 ENCOUNTER — Encounter: Payer: Self-pay | Admitting: Internal Medicine

## 2019-10-02 ENCOUNTER — Ambulatory Visit (INDEPENDENT_AMBULATORY_CARE_PROVIDER_SITE_OTHER): Payer: 59 | Admitting: Internal Medicine

## 2019-10-02 DIAGNOSIS — K589 Irritable bowel syndrome without diarrhea: Secondary | ICD-10-CM | POA: Insufficient documentation

## 2019-10-02 DIAGNOSIS — K581 Irritable bowel syndrome with constipation: Secondary | ICD-10-CM

## 2019-10-02 NOTE — Progress Notes (Signed)
Shelly James 54 y.o. Apr 26, 1966 [id number]  Assessment & Plan:  IBS (irritable bowel syndrome) Benefiber 1tbsp/day Hi fiber diet diet Seems like she is exercising adequately and has plenty of fluids She does report some birth trauma with her sons and their deliveries does not have genitourinary issues, could need functional rectal exam and consideration for pelvic floor physical therapy though my sense is that she does not have an outlet problem more of a transit issue  Follow-up as needed  I appreciate the opportunity to care for this patient. CC: Shelly Minium, MD   Subjective:   Chief Complaint: I feel full would like to move my bowels better  HPI Shelly James is here for follow-up, she had a diarrheal illness in late 2020 that was Salmonella she is recovered from that though it was very rough.  Now she is having fullness and bloating and moving her bowels only about every 3 to 4 days.  She thinks she eats in a healthy fashion though sometimes will eat "junk".  She does not have difficulty defecating when she gets the urge.  She is not taking anything extra to help her self defecate.  Coffee does not seem to stimulate things she has about 2 cups a day.  She exercises regularly as well.  She is not in great distress but thinks she would feel better if she had more frequent defecation as she definitely feels better when she empties out.  She is "in the throes of menopause" and is having hot flashes and some changes and wonders if that could have a role.  No rectal bleeding or hematochezia reported.  She is up-to-date with colonoscopy testing.  Had a diarrhea work-up in 2018 with a negative colonoscopy.  EGD at that time as well query eosinophilic esophagitis by appearance but the biopsies were not consistent. Allergies  Allergen Reactions  . Hydrocodone Hives   Current Meds  Medication Sig  . augmented betamethasone dipropionate (DIPROLENE-AF) 0.05 % ointment Apply  topically 2 (two) times daily.  . cetirizine (ZYRTEC) 10 MG tablet Take 10 mg by mouth daily.  Marland Kitchen desoximetasone (TOPICORT) 0.05 % cream Apply topically 2 (two) times daily.  . famotidine (PEPCID) 20 MG tablet Take 20 mg by mouth 2 (two) times daily.  Marland Kitchen SYNTHROID 88 MCG tablet Take 1 tablet (88 mcg total) by mouth daily.  . Vitamin D, Cholecalciferol, 50 MCG (2000 UT) CAPS Take by mouth.   Past Medical History:  Diagnosis Date  . Allergy   . Asthma   . Graves disease   . Hypothyroidism, postablative   . Urticaria   . Vaginal Pap smear, abnormal    Past Surgical History:  Procedure Laterality Date  . COLONOSCOPY    . ESOPHAGOGASTRODUODENOSCOPY    . LEEP    . TONSILLECTOMY AND ADENOIDECTOMY    . 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   Review of Systems As per HPI  Objective:   Physical Exam BP 116/72 (BP Location: Right Arm, Patient Position: Sitting, Cuff Size: Normal)   Pulse 72   Temp (!) 97.4 F (36.3 C)   Ht 5\' 7"  (1.702 m)   Wt  151 lb 6.4 oz (68.7 kg)   LMP 10/18/2017   SpO2 100%   BMI 23.71 kg/m  No acute distress healthy-appearing white woman

## 2019-10-02 NOTE — Assessment & Plan Note (Addendum)
Benefiber 1tbsp/day Hi fiber diet diet Seems like she is exercising adequately and has plenty of fluids She does report some birth trauma with her sons and their deliveries does not have genitourinary issues, could need functional rectal exam and consideration for pelvic floor physical therapy though my sense is that she does not have an outlet problem more of a transit issue It is certainly possible menopause and hormonal fluctuations could contribute to some degree I suspect.

## 2019-10-02 NOTE — Patient Instructions (Signed)
We are giving you a high fiber diet to read and follow.    Take one tablespoon of Benefiber daily and you may go up to 3 tablespoons daily if needed.   Follow up with Dr Leone Payor as needed.   I appreciate the opportunity to care for you. Stan Head, MD, [facility]

## 2019-11-01 ENCOUNTER — Encounter: Payer: 59 | Admitting: Family Medicine

## 2019-11-05 MED FILL — SYNTHROID 88 MCG TABLET: 88 | 30 days supply | Qty: 30 | Fill #5

## 2019-11-11 ENCOUNTER — Other Ambulatory Visit: Payer: Self-pay

## 2019-11-11 ENCOUNTER — Encounter: Payer: Self-pay | Admitting: Family Medicine

## 2019-11-11 ENCOUNTER — Ambulatory Visit (INDEPENDENT_AMBULATORY_CARE_PROVIDER_SITE_OTHER): Payer: 59 | Admitting: Family Medicine

## 2019-11-11 VITALS — BP 117/70 | HR 68 | Temp 98.0°F | Resp 16 | Ht 67.0 in | Wt 154.1 lb

## 2019-11-11 DIAGNOSIS — E559 Vitamin D deficiency, unspecified: Secondary | ICD-10-CM | POA: Diagnosis not present

## 2019-11-11 DIAGNOSIS — Z0000 Encounter for general adult medical examination without abnormal findings: Secondary | ICD-10-CM

## 2019-11-11 DIAGNOSIS — E890 Postprocedural hypothyroidism: Secondary | ICD-10-CM

## 2019-11-11 LAB — CBC WITH DIFFERENTIAL/PLATELET
Basophils Absolute: 0 10*3/uL (ref 0.0–0.1)
Basophils Relative: 0.8 % (ref 0.0–3.0)
Eosinophils Absolute: 0.2 10*3/uL (ref 0.0–0.7)
Eosinophils Relative: 3.1 % (ref 0.0–5.0)
HCT: 37.8 % (ref 36.0–46.0)
Hemoglobin: 12.2 g/dL (ref 12.0–15.0)
Lymphocytes Relative: 33.9 % (ref 12.0–46.0)
Lymphs Abs: 1.8 10*3/uL (ref 0.7–4.0)
MCHC: 32.3 g/dL (ref 30.0–36.0)
MCV: 84.1 fl (ref 78.0–100.0)
Monocytes Absolute: 0.3 10*3/uL (ref 0.1–1.0)
Monocytes Relative: 6.6 % (ref 3.0–12.0)
Neutro Abs: 2.9 10*3/uL (ref 1.4–7.7)
Neutrophils Relative %: 55.6 % (ref 43.0–77.0)
Platelets: 402 10*3/uL — ABNORMAL HIGH (ref 150.0–400.0)
RBC: 4.5 Mil/uL (ref 3.87–5.11)
RDW: 14.4 % (ref 11.5–15.5)
WBC: 5.2 10*3/uL (ref 4.0–10.5)

## 2019-11-11 LAB — TSH: TSH: 0.72 u[IU]/mL (ref 0.35–4.50)

## 2019-11-11 LAB — VITAMIN D 25 HYDROXY (VIT D DEFICIENCY, FRACTURES): VITD: 34.03 ng/mL (ref 30.00–100.00)

## 2019-11-11 MED ORDER — OMEPRAZOLE 20 MG PO CPDR: 20.0000 mg | DELAYED_RELEASE_CAPSULE | Freq: Every day | ORAL | 3 refills | Status: DC

## 2019-11-11 MED FILL — OMEPRAZOLE 20 MG CAP: 20 | 90 days supply | Qty: 90 | Fill #0

## 2019-11-11 NOTE — Assessment & Plan Note (Signed)
Check labs.  Adjust meds prn  

## 2019-11-11 NOTE — Progress Notes (Signed)
   Subjective:    Patient ID: Shelly James, female    DOB: 1966/01/13, 54 y.o.   MRN: [id number]  HPI CPE- UTD on pap, due for mammo (pt to schedule), UTD on colonoscopy, immunizations   Review of Systems Patient reports no vision/ hearing changes, adenopathy,fever, weight change,  persistant/recurrent hoarseness , swallowing issues, chest pain, palpitations, edema, persistant/recurrent cough, hemoptysis, dyspnea (rest/exertional/paroxysmal nocturnal), gastrointestinal bleeding (melena, rectal bleeding), abdominal pain, bowel changes, GU symptoms (dysuria, hematuria, incontinence), Gyn symptoms (abnormal  bleeding, pain),  syncope, focal weakness, memory loss, numbness & tingling, skin/hair/nail changes, abnormal bruising or bleeding, anxiety, or depression.   + GERD  This visit occurred during the SARS-CoV-2 public health emergency.  Safety protocols were in place, including screening questions prior to the visit, additional usage of staff PPE, and extensive cleaning of exam room while observing appropriate contact time as indicated for disinfecting solutions.       Objective:   Physical Exam General Appearance:    Alert, cooperative, no distress, appears stated age  Head:    Normocephalic, without obvious abnormality, atraumatic  Eyes:    PERRL, conjunctiva/corneas clear, EOM's intact, fundi    benign, both eyes  Ears:    Normal TM's and external ear canals, both ears  Nose:   Deferred due to COVID  Throat:   Neck:   Supple, symmetrical, trachea midline, no adenopathy;    Thyroid: no enlargement/tenderness/nodules  Back:     Symmetric, no curvature, ROM normal, no CVA tenderness  Lungs:     Clear to auscultation bilaterally, respirations unlabored  Chest Wall:    No tenderness or deformity   Heart:    Regular rate and rhythm, S1 and S2 normal, no murmur, rub   or gallop  Breast Exam:    Deferred to GYN  Abdomen:     Soft, non-tender, bowel sounds active all four quadrants,     no masses, no organomegaly  Genitalia:    Deferred to GYN  Rectal:    Extremities:   Extremities normal, atraumatic, no cyanosis or edema  Pulses:   2+ and symmetric all extremities  Skin:   Skin color, texture, turgor normal, no rashes or lesions  Lymph nodes:   Cervical, supraclavicular, and axillary nodes normal  Neurologic:   CNII-XII intact, normal strength, sensation and reflexes    throughout          Assessment & Plan:

## 2019-11-11 NOTE — Assessment & Plan Note (Signed)
Pt has hx of similar.  Check labs and replete prn. °

## 2019-11-11 NOTE — Patient Instructions (Signed)
Follow up in 1 year or as needed We'll notify you of your lab results and make any changes if needed Continue to work on healthy diet and regular exercise- you can do it! Call with any questions or concerns Stay Safe!  Stay Healthy! 

## 2019-11-11 NOTE — Assessment & Plan Note (Signed)
Pt's PE WNL.  UTD on pap, colonoscopy, immunizations.  Pt to schedule mammo.  Check labs.  Anticipatory guidance provided.

## 2019-11-12 LAB — LIPID PANEL
Cholesterol: 233 mg/dL — ABNORMAL HIGH (ref 0–200)
HDL: 70.3 mg/dL (ref >39.00)
LDL Cholesterol: 134 mg/dL — ABNORMAL HIGH (ref 0–99)
NonHDL: 162.97
Total CHOL/HDL Ratio: 3
Triglycerides: 145 mg/dL (ref 0.0–149.0)
VLDL: 29 mg/dL (ref 0.0–40.0)

## 2019-11-12 LAB — BASIC METABOLIC PANEL
BUN: 17 mg/dL (ref 6–23)
CO2: 25 mEq/L (ref 19–32)
Calcium: 9.3 mg/dL (ref 8.4–10.5)
Chloride: 103 mEq/L (ref 96–112)
Creatinine, Ser: 0.87 mg/dL (ref 0.40–1.20)
GFR: 68.36 mL/min (ref >60.00)
Glucose, Bld: 111 mg/dL — ABNORMAL HIGH (ref 70–99)
Potassium: 5 mEq/L (ref 3.5–5.1)
Sodium: 137 mEq/L (ref 135–145)

## 2019-11-12 LAB — HEPATIC FUNCTION PANEL
ALT: 12 U/L (ref 0–35)
AST: 19 U/L (ref 0–37)
Albumin: 4.5 g/dL (ref 3.5–5.2)
Alkaline Phosphatase: 74 U/L (ref 39–117)
Bilirubin, Direct: 0.1 mg/dL (ref 0.0–0.3)
Total Bilirubin: 0.3 mg/dL (ref 0.2–1.2)
Total Protein: 6.9 g/dL (ref 6.0–8.3)

## 2019-11-13 ENCOUNTER — Encounter: Payer: Self-pay | Admitting: Family Medicine

## 2019-11-15 ENCOUNTER — Other Ambulatory Visit: Payer: Self-pay | Admitting: Family Medicine

## 2019-11-15 DIAGNOSIS — Z1231 Encounter for screening mammogram for malignant neoplasm of breast: Secondary | ICD-10-CM

## 2019-12-07 ENCOUNTER — Other Ambulatory Visit: Payer: Self-pay | Admitting: Family Medicine

## 2019-12-09 MED FILL — SYNTHROID 88 MCG TABLET: 88 | 30 days supply | Qty: 30 | Fill #0

## 2019-12-17 ENCOUNTER — Other Ambulatory Visit: Payer: Self-pay

## 2019-12-17 ENCOUNTER — Ambulatory Visit
Admission: RE | Admit: 2019-12-17 | Discharge: 2019-12-17 | Disposition: A | Discharge status: Home / Self Care | Payer: 59 | Source: Ambulatory Visit | Attending: Family Medicine | Admitting: Family Medicine

## 2019-12-17 DIAGNOSIS — Z1231 Encounter for screening mammogram for malignant neoplasm of breast: Secondary | ICD-10-CM | POA: Diagnosis not present

## 2020-01-01 DIAGNOSIS — H5203 Hypermetropia, bilateral: Secondary | ICD-10-CM | POA: Diagnosis not present

## 2020-01-01 DIAGNOSIS — H524 Presbyopia: Secondary | ICD-10-CM | POA: Diagnosis not present

## 2020-01-10 MED FILL — SYNTHROID 88 MCG TABLET: 88 | 30 days supply | Qty: 30 | Fill #1

## 2020-02-10 ENCOUNTER — Other Ambulatory Visit: Payer: Self-pay | Admitting: Family Medicine

## 2020-02-10 ENCOUNTER — Other Ambulatory Visit: Payer: Self-pay | Admitting: General Practice

## 2020-02-10 MED ORDER — SYNTHROID 88 MCG PO TABS: 88.0000 ug | ORAL_TABLET | Freq: Every day | ORAL | 1 refills | Status: DC

## 2020-02-10 MED FILL — SYNTHROID 88 MCG TABLET: 88 | 90 days supply | Qty: 90 | Fill #0

## 2020-04-29 MED FILL — SYNTHROID 88 MCG TABLET: 88 | 90 days supply | Qty: 90 | Fill #1

## 2020-06-05 DIAGNOSIS — Z20828 Contact with and (suspected) exposure to other viral communicable diseases: Secondary | ICD-10-CM | POA: Diagnosis not present

## 2020-06-05 DIAGNOSIS — Z7189 Other specified counseling: Secondary | ICD-10-CM | POA: Diagnosis not present

## 2020-06-05 DIAGNOSIS — J019 Acute sinusitis, unspecified: Secondary | ICD-10-CM | POA: Diagnosis not present

## 2020-07-14 ENCOUNTER — Telehealth: Payer: 59 | Admitting: Nurse Practitioner

## 2020-07-14 ENCOUNTER — Other Ambulatory Visit: Payer: Self-pay | Admitting: Nurse Practitioner

## 2020-07-14 DIAGNOSIS — R059 Cough, unspecified: Secondary | ICD-10-CM

## 2020-07-14 MED ORDER — BENZONATATE 100 MG PO CAPS: 100.0000 mg | ORAL_CAPSULE | Freq: Three times a day (TID) | ORAL | 0 refills | Status: DC | PRN

## 2020-07-14 MED FILL — BENZONATATE 100 MG CAPS: 100 | 6 days supply | Qty: 20 | Fill #0

## 2020-07-14 NOTE — Progress Notes (Signed)
We are sorry that you are not feeling well.  Here is how we plan to help!  Based on your presentation I believe you most likely have A cough due to a virus.  This is called viral bronchitis and is best treated by rest, plenty of fluids and control of the cough.  You may use Ibuprofen or Tylenol as directed to help your symptoms.     In addition you may use A prescription cough medication called Tessalon Perles 100mg . You may take 1-2 capsules every 8 hours as needed for your cough. there is no othe rcough meds that arent the same as OTC meds.  * you can also try taking omeprazole OTC daily because egastric reflux can make you cough as well.  From your responses in the eVisit questionnaire you describe inflammation in the upper respiratory tract which is causing a significant cough.  This is commonly called Bronchitis and has four common causes:    Allergies  Viral Infections  Acid Reflux  Bacterial Infection Allergies, viruses and acid reflux are treated by controlling symptoms or eliminating the cause. An example might be a cough caused by taking certain blood pressure medications. You stop the cough by changing the medication. Another example might be a cough caused by acid reflux. Controlling the reflux helps control the cough.  USE OF BRONCHODILATOR ("RESCUE") INHALERS: There is a risk from using your bronchodilator too frequently.  The risk is that over-reliance on a medication which only relaxes the muscles surrounding the breathing tubes can reduce the effectiveness of medications prescribed to reduce swelling and congestion of the tubes themselves.  Although you feel brief relief from the bronchodilator inhaler, your asthma may actually be worsening with the tubes becoming more swollen and filled with mucus.  This can delay other crucial treatments, such as oral steroid medications. If you need to use a bronchodilator inhaler daily, several times per day, you should discuss this with your  provider.  There are probably better treatments that could be used to keep your asthma under control.     HOME CARE  Only take medications as instructed by your medical team.  Complete the entire course of an antibiotic.  Drink plenty of fluids and get plenty of rest.  Avoid close contacts especially the very young and the elderly  Cover your mouth if you cough or cough into your sleeve.  Always remember to wash your hands  A steam or ultrasonic humidifier can help congestion.   GET HELP RIGHT AWAY IF:  You develop worsening fever.  You become short of breath  You cough up blood.  Your symptoms persist after you have completed your treatment plan MAKE SURE YOU   Understand these instructions.  Will watch your condition.  Will get help right away if you are not doing well or get worse.  Your e-visit answers were reviewed by a board certified advanced clinical practitioner to complete your personal care plan.  Depending on the condition, your plan could have included both over the counter or prescription medications. If there is a problem please reply  once you have received a response from your provider. Your safety is important to .  If you have drug allergies check your prescription carefully.    You can use MyChart to ask questions about todays visit, request a non-urgent call back, or ask for a work or school excuse for 24 hours related to this e-Visit. If it has been greater than 24 hours you will need  to follow up with your provider, or enter a new e-Visit to address those concerns. You will get an e-mail in the next two days asking about your experience.  I hope that your e-visit has been valuable and will speed your recovery. Thank you for using e-visits.  5-10 minutes spent reviewing and documenting in chart.

## 2020-07-24 ENCOUNTER — Emergency Department
Admission: EM | Admit: 2020-07-24 | Discharge: 2020-07-24 | Disposition: A | Discharge status: Home / Self Care | Payer: 59 | Source: Home / Self Care

## 2020-07-24 ENCOUNTER — Telehealth: Payer: Self-pay | Admitting: Family Medicine

## 2020-07-24 ENCOUNTER — Emergency Department (INDEPENDENT_AMBULATORY_CARE_PROVIDER_SITE_OTHER): Payer: 59

## 2020-07-24 ENCOUNTER — Other Ambulatory Visit: Payer: Self-pay | Admitting: Emergency Medicine

## 2020-07-24 ENCOUNTER — Other Ambulatory Visit: Payer: Self-pay

## 2020-07-24 DIAGNOSIS — J069 Acute upper respiratory infection, unspecified: Secondary | ICD-10-CM

## 2020-07-24 DIAGNOSIS — R059 Cough, unspecified: Secondary | ICD-10-CM

## 2020-07-24 DIAGNOSIS — R053 Chronic cough: Secondary | ICD-10-CM

## 2020-07-24 DIAGNOSIS — J9801 Acute bronchospasm: Secondary | ICD-10-CM

## 2020-07-24 DIAGNOSIS — R062 Wheezing: Secondary | ICD-10-CM

## 2020-07-24 MED ORDER — ALBUTEROL SULFATE HFA 108 (90 BASE) MCG/ACT IN AERS: 1.0000 | INHALATION_SPRAY | Freq: Four times a day (QID) | RESPIRATORY_TRACT | 0 refills | Status: DC | PRN

## 2020-07-24 MED ORDER — DOXYCYCLINE HYCLATE 100 MG PO CAPS: 100.0000 mg | ORAL_CAPSULE | Freq: Two times a day (BID) | ORAL | 0 refills | Status: DC

## 2020-07-24 MED FILL — ALBUTEROL SULFATE HFA 108 (: 108 (90 BAS | 25 days supply | Qty: 153 | Fill #0

## 2020-07-24 MED FILL — DOXYCYCLINE HYCLATE 100 MG: 100 | 10 days supply | Qty: 20 | Fill #0

## 2020-07-24 NOTE — ED Triage Notes (Signed)
Patient presents to Urgent Care with complaints of cough and nasal congestion since 5 weeks ago after cleaning out her mother's house. Patient reports she had a negative covid test shortly after sx developed. Saw an UC provider in [place] and was prescribed antibiotics and prednisone. Took the antibiotics immediately, then prednisone later. Symptoms have not resolved, has been taking mucinex and other otc medications.

## 2020-07-24 NOTE — Telephone Encounter (Signed)
Noted  

## 2020-07-24 NOTE — Telephone Encounter (Signed)
Ok for an in office visit,   I have been coughing for 6 weeks, one round of antibiotics and a round of prednisone and its still here.  She scheduled it for next Friday 07/31/20

## 2020-07-24 NOTE — ED Provider Notes (Signed)
[name]    CSN: [id number] Arrival date & time: 07/24/20  1425      History   Chief Complaint Chief Complaint  Patient presents with  . Cough  . Nasal Congestion    HPI Shelly James is a 54 y.o. female.   HPI  Shelly James is a 54 y.o. female presenting to UC with c/o cough and nasal congestion for about 5 weeks after cleaning out her mother's house. Pt reports having a Negative COVID test and completing a course of amoxicillin and 3 days of a 5 day course of oral prednisone 50mg  once daily. Pt stopped the prednisone because she does not like how it makes her feel.  Denies fever, chills, n/v/d.  Hx of asthma. She has been using her albuterol inhaler with minimal relief. She completed an Evisit on 07/14/20 and has been taking the tessalon as prescribed with mild relief.  Hx of persistent cough in the past. She was last seen by a pulmonologist about 4-5 years ago.     Past Medical History:  Diagnosis Date  . Allergy   . Asthma   . Graves disease   . Hypothyroidism, postablative   . Urticaria   . Vaginal Pap smear, abnormal     Patient Active Problem List   Diagnosis Date Noted  . Vitamin D deficiency 11/11/2019  . IBS (irritable bowel syndrome) 10/02/2019  . Physical exam 02/15/2018  . Chronic idiopathic urticaria 12/27/2016  . Hypothyroidism, postablative   . ALLERGIC RHINITIS 07/05/2010  . GERD 07/05/2010    Past Surgical History:  Procedure Laterality Date  . COLONOSCOPY    . ESOPHAGOGASTRODUODENOSCOPY    . LEEP    . TONSILLECTOMY AND ADENOIDECTOMY    . wisdom      OB History    Gravida  2   Para  2   Term  2   Preterm      AB      Living  2     SAB      TAB      Ectopic      Multiple      Live Births               Home Medications    Prior to Admission medications   Medication Sig Start Date End Date Taking? Authorizing Provider  albuterol (VENTOLIN HFA) 108 (90 Base) MCG/ACT inhaler Inhale 1-2  puffs into the lungs every 6 (six) hours as needed for wheezing or shortness of breath. 07/24/20   13/5/21, PA-C  augmented betamethasone dipropionate (DIPROLENE-AF) 0.05 % ointment Apply topically 2 (two) times daily. 08/15/19   08/17/19, PA-C  benzonatate (TESSALON PERLES) 100 MG capsule Take 1 capsule (100 mg total) by mouth 3 (three) times daily as needed. 07/14/20   07/16/20, Mary-Margaret, FNP  cetirizine (ZYRTEC) 10 MG tablet Take 10 mg by mouth daily.    [provider]  doxycycline (VIBRAMYCIN) 100 MG capsule Take 1 capsule (100 mg total) by mouth 2 (two) times daily. 07/24/20   13/5/21, PA-C  omeprazole (PRILOSEC) 20 MG capsule Take 1 capsule (20 mg total) by mouth daily. 11/11/19   11/13/19, MD  SYNTHROID 88 MCG tablet TAKE 1 TABLET BY MOUTH DAILY. 02/10/20   02/12/20, MD  SYNTHROID 88 MCG tablet Take 1 tablet (88 mcg total) by mouth daily. 02/10/20   02/12/20, MD  Vitamin D, Cholecalciferol, 50 MCG (2000 UT)  CAPS Take by mouth.    [provider]    Family History Family History  Problem Relation Age of Onset  . Celiac disease Sister   . Raynaud syndrome Sister   . Cancer Father        mesothelioma  . Thyroid disease Maternal Grandmother   . Allergic rhinitis Neg Hx   . Angioedema Neg Hx   . Asthma Neg Hx   . Immunodeficiency Neg Hx   . Urticaria Neg Hx   . Eczema Neg Hx   . Colon cancer Neg Hx   . Stomach cancer Neg Hx     Social History Social History   Tobacco Use  . Smoking status: Never Smoker  . Smokeless tobacco: Never Used  Vaping Use  . Vaping Use: Never used  Substance Use Topics  . Alcohol use: Yes    Comment: occ  . Drug use: No     Allergies   Hydrocodone   Review of Systems Review of Systems  Constitutional: Negative for chills and fever.  HENT: Positive for congestion. Negative for ear pain, sore throat, trouble swallowing and voice change.   Respiratory: Positive for cough  and wheezing. Negative for shortness of breath.   Cardiovascular: Negative for chest pain and palpitations.  Gastrointestinal: Negative for abdominal pain, diarrhea, nausea and vomiting.  Musculoskeletal: Negative for arthralgias, back pain and myalgias.  Skin: Negative for rash.  All other systems reviewed and are negative.    Physical Exam Triage Vital Signs ED Triage Vitals  Enc Vitals Group     BP 07/24/20 1448 123/84     Pulse Rate 07/24/20 1448 92     Resp 07/24/20 1448 16     Temp 07/24/20 1448 98 F (36.7 C)     Temp Source 07/24/20 1448 Oral     SpO2 07/24/20 1448 99 %     Weight --      Height --      Head Circumference --      Peak Flow --      Pain Score 07/24/20 1446 1     Pain Loc --      Pain Edu? --      Excl. in GC? --    No data found.  Updated Vital Signs BP 123/84 (BP Location: Right Arm)   Pulse 92   Temp 98 F (36.7 C) (Oral)   Resp 16   LMP 10/18/2017   SpO2 99%   Visual Acuity Right Eye Distance:   Left Eye Distance:   Bilateral Distance:    Right Eye Near:   Left Eye Near:    Bilateral Near:     Physical Exam Vitals and nursing note reviewed.  Constitutional:      General: She is not in acute distress.    Appearance: Normal appearance. She is well-developed. She is not ill-appearing, toxic-appearing or diaphoretic.  HENT:     Head: Normocephalic and atraumatic.     Right Ear: Tympanic membrane and ear canal normal.     Left Ear: Tympanic membrane and ear canal normal.     Nose: Nose normal.     Mouth/Throat:     Mouth: Mucous membranes are moist.     Pharynx: Oropharynx is clear.  Cardiovascular:     Rate and Rhythm: Normal rate and regular rhythm.  Pulmonary:     Effort: Pulmonary effort is normal. No respiratory distress.     Breath sounds: No stridor. Wheezing and rhonchi present. No rales.  Comments: Faint diffuse wheeze and rhonchi, worse in lower lung fields. No respiratory distress.  Musculoskeletal:         General: Normal range of motion.     Cervical back: Normal range of motion and neck supple.  Skin:    General: Skin is warm and dry.  Neurological:     Mental Status: She is alert and oriented to person, place, and time.  Psychiatric:        Behavior: Behavior normal.      UC Treatments / Results  Labs (all labs ordered are listed, but only abnormal results are displayed) Labs Reviewed - No data to display  EKG   Radiology DG Chest 2 View  Result Date: 07/24/2020 CLINICAL DATA:  Persistent cough EXAM: CHEST - 2 VIEW COMPARISON:  None. FINDINGS: The heart size and mediastinal contours are within normal limits. Both lungs are clear. The visualized skeletal structures are unremarkable. IMPRESSION: No active cardiopulmonary disease. Electronically Signed   By: Jonna Clark M.D.   On: 07/24/2020 15:29    Procedures Procedures (including critical care time)  Medications Ordered in UC Medications - No data to display  Initial Impression / Assessment and Plan / UC Course  I have reviewed the triage vital signs and the nursing notes.  Pertinent labs & imaging results that were available during my care of the patient were reviewed by me and considered in my medical decision making (see chart for details).     Discussed imaging with pt Will cover for atypical bacteria Encouraged f/u with PCP and possibly pulmonologist if not improving by next week AVS given  Final Clinical Impressions(s) / UC Diagnoses   Final diagnoses:  Upper respiratory tract infection, unspecified type  Wheeze  Persistent cough for 3 weeks or longer  Bronchospasm, acute     Discharge Instructions      Follow up with primary care or pulmonology next week if not improving.     ED Prescriptions    Medication Sig Dispense Auth. Provider   doxycycline (VIBRAMYCIN) 100 MG capsule Take 1 capsule (100 mg total) by mouth 2 (two) times daily. 20 capsule Doroteo Glassman, Deztinee Lohmeyer O, PA-C   albuterol (VENTOLIN HFA)  108 (90 Base) MCG/ACT inhaler Inhale 1-2 puffs into the lungs every 6 (six) hours as needed for wheezing or shortness of breath. 18 g Lurene Shadow, PA-C     PDMP not reviewed this encounter.   Lurene Shadow, [place] 07/24/20 1604

## 2020-07-24 NOTE — Discharge Instructions (Signed)
  Follow up with primary care or pulmonology next week if not improving.

## 2020-07-27 NOTE — Telephone Encounter (Signed)
Ok for in-office visit. 

## 2020-07-27 NOTE — Telephone Encounter (Signed)
Ok for an in office visit?  

## 2020-07-30 ENCOUNTER — Other Ambulatory Visit: Payer: Self-pay | Admitting: Family Medicine

## 2020-07-30 MED FILL — SYNTHROID 88 MCG TABLET: 88 | 90 days supply | Qty: 90 | Fill #0

## 2020-07-31 ENCOUNTER — Ambulatory Visit: Payer: 59 | Admitting: Family Medicine

## 2020-08-02 ENCOUNTER — Emergency Department
Admission: EM | Admit: 2020-08-02 | Discharge: 2020-08-02 | Disposition: A | Discharge status: Home / Self Care | Payer: 59 | Source: Home / Self Care

## 2020-08-02 ENCOUNTER — Other Ambulatory Visit: Payer: Self-pay

## 2020-08-02 DIAGNOSIS — H109 Unspecified conjunctivitis: Secondary | ICD-10-CM

## 2020-08-02 DIAGNOSIS — R053 Chronic cough: Secondary | ICD-10-CM

## 2020-08-02 MED ORDER — IPRATROPIUM BROMIDE 0.06 % NA SOLN: 2.0000 | Freq: Four times a day (QID) | NASAL | 1 refills | Status: DC

## 2020-08-02 MED ORDER — ERYTHROMYCIN 5 MG/GM OP OINT: TOPICAL_OINTMENT | Freq: Three times a day (TID) | OPHTHALMIC | 0 refills | Status: AC

## 2020-08-02 NOTE — Discharge Instructions (Signed)
  Please call to schedule an appointment with an ear nose and throat specialist for persistent sinus symptoms.

## 2020-08-02 NOTE — ED Triage Notes (Signed)
Patient here for conjunctivitis starting yesterday; in both eyes; has been on antibiotics for sinus infection past 6 weeks and is completing that tomorrow.  Has had covid vaccinations and flu shot.

## 2020-08-02 NOTE — ED Provider Notes (Signed)
[facility]    CSN: [id number] Arrival date & time: 08/02/20  6160      History   Chief Complaint Chief Complaint  Patient presents with  . Conjunctivitis    HPI Shelly James is a 54 y.o. female.   HPI Shelly James is a 54 y.o. female presenting to UC with c/o Left eye redness, itching and soreness that started yesterday. Associated yellow discharge this morning. Pt has been on 2 different antibiotics over the last 6 weeks for URI symptoms. She completed a course of amoxicillin prescribed via televist and will complete a course of doxycycline tomorrow, prescribed on 07/24/20. Pt states the cough has improved some but still has congestion.  She was suppose to follow up on 07/31/20 but states she was feeling better so she canceled the appointment.  She cannot f/u with pulmonology until mid-December and PCP will not see her in person because of the cough. Denies fever, chills, n/v/d. No chest pain or SOB.   Past Medical History:  Diagnosis Date  . Allergy   . Asthma   . Graves disease   . Hypothyroidism, postablative   . Urticaria   . Vaginal Pap smear, abnormal     Patient Active Problem List   Diagnosis Date Noted  . Vitamin D deficiency 11/11/2019  . IBS (irritable bowel syndrome) 10/02/2019  . Physical exam 02/15/2018  . Chronic idiopathic urticaria 12/27/2016  . Hypothyroidism, postablative   . ALLERGIC RHINITIS 07/05/2010  . GERD 07/05/2010    Past Surgical History:  Procedure Laterality Date  . COLONOSCOPY    . ESOPHAGOGASTRODUODENOSCOPY    . LEEP    . TONSILLECTOMY AND ADENOIDECTOMY    . wisdom      OB History    Gravida  2   Para  2   Term  2   Preterm      AB      Living  2     SAB      TAB      Ectopic      Multiple      Live Births               Home Medications    Prior to Admission medications   Medication Sig Start Date End Date Taking? Authorizing Provider  albuterol (VENTOLIN HFA) 108 (90  Base) MCG/ACT inhaler Inhale 1-2 puffs into the lungs every 6 (six) hours as needed for wheezing or shortness of breath. 07/24/20   Lurene Shadow, PA-C  augmented betamethasone dipropionate (DIPROLENE-AF) 0.05 % ointment Apply topically 2 (two) times daily. 08/15/19   Remus Loffler, PA-C  benzonatate (TESSALON PERLES) 100 MG capsule Take 1 capsule (100 mg total) by mouth 3 (three) times daily as needed. 07/14/20   Daphine Deutscher, Mary-Margaret, FNP  cetirizine (ZYRTEC) 10 MG tablet Take 10 mg by mouth daily.    [provider]  doxycycline (VIBRAMYCIN) 100 MG capsule Take 1 capsule (100 mg total) by mouth 2 (two) times daily. 07/24/20   Lurene Shadow, PA-C  erythromycin ophthalmic ointment Place into the left eye 3 (three) times daily for 5 days. Place a 1/2 inch ribbon of ointment into the lower eyelid. 08/02/20 08/07/20  Lurene Shadow, PA-C  ipratropium (ATROVENT) 0.06 % nasal spray Place 2 sprays into both nostrils 4 (four) times daily. 08/02/20   Lurene Shadow, PA-C  omeprazole (PRILOSEC) 20 MG capsule Take 1 capsule (20 mg total) by mouth daily. 11/11/19   Beverely Low,  Helane Rima, MD  SYNTHROID 88 MCG tablet TAKE 1 TABLET BY MOUTH DAILY. 02/10/20   Sheliah Hatch, MD  SYNTHROID 88 MCG tablet TAKE 1 TABLET BY MOUTH DAILY 07/30/20   Sheliah Hatch, MD  Vitamin D, Cholecalciferol, 50 MCG (2000 UT) CAPS Take by mouth.    [provider]    Family History Family History  Problem Relation Age of Onset  . Celiac disease Sister   . Raynaud syndrome Sister   . Cancer Father        mesothelioma  . Thyroid disease Maternal Grandmother   . Allergic rhinitis Neg Hx   . Angioedema Neg Hx   . Asthma Neg Hx   . Immunodeficiency Neg Hx   . Urticaria Neg Hx   . Eczema Neg Hx   . Colon cancer Neg Hx   . Stomach cancer Neg Hx     Social History Social History   Tobacco Use  . Smoking status: Never Smoker  . Smokeless tobacco: Never Used  Vaping Use  . Vaping Use: Never used    Substance Use Topics  . Alcohol use: Yes    Comment: occ  . Drug use: No     Allergies   Hydrocodone   Review of Systems Review of Systems  Constitutional: Negative for chills and fever.  HENT: Positive for congestion and sinus pressure. Negative for ear pain, sinus pain, sore throat, trouble swallowing and voice change.   Eyes: Positive for pain, discharge, redness and itching. Negative for photophobia and visual disturbance.  Respiratory: Positive for cough. Negative for shortness of breath.   Cardiovascular: Negative for chest pain and palpitations.  Gastrointestinal: Negative for abdominal pain, diarrhea, nausea and vomiting.  Musculoskeletal: Negative for arthralgias, back pain and myalgias.  Skin: Negative for rash.  Neurological: Negative for dizziness, light-headedness and headaches.  All other systems reviewed and are negative.    Physical Exam Triage Vital Signs ED Triage Vitals  Enc Vitals Group     BP 08/02/20 0845 123/88     Pulse Rate 08/02/20 0845 98     Resp 08/02/20 0845 16     Temp --      Temp src --      SpO2 08/02/20 0845 100 %     Weight 08/02/20 0846 146 lb (66.2 kg)     Height 08/02/20 0846 5\' 7"  (1.702 m)     Head Circumference --      Peak Flow --      Pain Score 08/02/20 0845 5     Pain Loc --      Pain Edu? --      Excl. in GC? --    No data found.  Updated Vital Signs BP 123/88 (BP Location: Right Arm)   Pulse 98   Resp 16   Ht 5\' 7"  (1.702 m)   Wt 146 lb (66.2 kg)   LMP 10/18/2017   SpO2 100%   BMI 22.87 kg/m   Visual Acuity Right Eye Distance:   Left Eye Distance:   Bilateral Distance:    Right Eye Near:   Left Eye Near:    Bilateral Near:     Physical Exam Vitals and nursing note reviewed.  Constitutional:      General: She is not in acute distress.    Appearance: Normal appearance. She is well-developed. She is not ill-appearing, toxic-appearing or diaphoretic.  HENT:     Head: Normocephalic and atraumatic.      Right Ear:  Tympanic membrane and ear canal normal.     Left Ear: Tympanic membrane and ear canal normal.     Nose: Nose normal.     Mouth/Throat:     Mouth: Mucous membranes are moist.     Pharynx: Oropharynx is clear.  Eyes:     General: Lids are normal. Vision grossly intact.        Right eye: No foreign body, discharge or hordeolum.        Left eye: Discharge (small amount thin yellow discharge) present.No foreign body or hordeolum.     Extraocular Movements: Extraocular movements intact.     Conjunctiva/sclera:     Right eye: Right conjunctiva is not injected.     Left eye: Left conjunctiva is injected.     Pupils: Pupils are equal, round, and reactive to light.  Cardiovascular:     Rate and Rhythm: Normal rate and regular rhythm.  Pulmonary:     Effort: Pulmonary effort is normal. No respiratory distress.     Breath sounds: Normal breath sounds. No stridor. No wheezing, rhonchi or rales.  Musculoskeletal:        General: Normal range of motion.     Cervical back: Normal range of motion.  Skin:    General: Skin is warm and dry.  Neurological:     Mental Status: She is alert and oriented to person, place, and time.  Psychiatric:        Behavior: Behavior normal.      UC Treatments / Results  Labs (all labs ordered are listed, but only abnormal results are displayed) Labs Reviewed  COVID-19, FLU A+B AND RSV    EKG   Radiology No results found.  Procedures Procedures (including critical care time)  Medications Ordered in UC Medications - No data to display  Initial Impression / Assessment and Plan / UC Course  I have reviewed the triage vital signs and the nursing notes.  Pertinent labs & imaging results that were available during my care of the patient were reviewed by me and considered in my medical decision making (see chart for details).     Hx and exam c/w bacterial conjunctivitis of Left eye Pt also reports continued cough. Pt had normal CXR last  week, encouraged to finish the doxycycline, follow up with pulmonology but to also try to f/u with ENT and PCP in person for recheck of symptoms this week. AVS given  Final Clinical Impressions(s) / UC Diagnoses   Final diagnoses:  Persistent cough for 3 weeks or longer  Bacterial conjunctivitis of left eye     Discharge Instructions      Please call to schedule an appointment with an ear nose and throat specialist for persistent sinus symptoms.     ED Prescriptions    Medication Sig Dispense Auth. Provider   erythromycin ophthalmic ointment Place into the left eye 3 (three) times daily for 5 days. Place a 1/2 inch ribbon of ointment into the lower eyelid. 3.5 g Doroteo Glassman, Anah Billard O, PA-C   ipratropium (ATROVENT) 0.06 % nasal spray Place 2 sprays into both nostrils 4 (four) times daily. 15 mL Lurene Shadow, PA-C     PDMP not reviewed this encounter.   Lurene Shadow, PA-C 08/02/20 1152

## 2020-08-04 LAB — COVID-19, FLU A+B AND RSV
Influenza A, NAA: NOT DETECTED
Influenza B, NAA: NOT DETECTED
RSV, NAA: NOT DETECTED
SARS-CoV-2, NAA: NOT DETECTED

## 2020-08-12 DIAGNOSIS — J341 Cyst and mucocele of nose and nasal sinus: Secondary | ICD-10-CM | POA: Diagnosis not present

## 2020-08-12 DIAGNOSIS — J342 Deviated nasal septum: Secondary | ICD-10-CM | POA: Diagnosis not present

## 2020-08-12 DIAGNOSIS — R519 Headache, unspecified: Secondary | ICD-10-CM | POA: Diagnosis not present

## 2020-08-12 DIAGNOSIS — J320 Chronic maxillary sinusitis: Secondary | ICD-10-CM | POA: Insufficient documentation

## 2020-08-12 DIAGNOSIS — J3489 Other specified disorders of nose and nasal sinuses: Secondary | ICD-10-CM | POA: Diagnosis not present

## 2020-08-12 DIAGNOSIS — G8929 Other chronic pain: Secondary | ICD-10-CM | POA: Diagnosis not present

## 2020-08-15 DIAGNOSIS — Z20822 Contact with and (suspected) exposure to covid-19: Secondary | ICD-10-CM | POA: Diagnosis not present

## 2020-08-16 ENCOUNTER — Telehealth: Payer: 59 | Admitting: Physician Assistant

## 2020-08-16 ENCOUNTER — Encounter: Payer: Self-pay | Admitting: Physician Assistant

## 2020-08-16 DIAGNOSIS — B373 Candidiasis of vulva and vagina: Secondary | ICD-10-CM

## 2020-08-16 DIAGNOSIS — B3731 Acute candidiasis of vulva and vagina: Secondary | ICD-10-CM

## 2020-08-16 MED ORDER — FLUCONAZOLE 150 MG PO TABS: 150.0000 mg | ORAL_TABLET | Freq: Once | ORAL | 0 refills | Status: AC

## 2020-08-16 NOTE — Progress Notes (Signed)
We are sorry that you are not feeling well. Here is how we plan to help! Based on what you shared with me it looks like you: May have a yeast vaginosis  Vaginosis is an inflammation of the vagina that can result in discharge, itching and pain. The cause is usually a change in the normal balance of vaginal bacteria or an infection. Vaginosis can also result from reduced estrogen levels after menopause.  The most common causes of vaginosis are:   Bacterial vaginosis which results from an overgrowth of one on several organisms that are normally present in your vagina.   Yeast infections which are caused by a naturally occurring fungus called candida.   Vaginal atrophy (atrophic vaginosis) which results from the thinning of the vagina from reduced estrogen levels after menopause.   Trichomoniasis which is caused by a parasite and is commonly transmitted by sexual intercourse.  Factors that increase your risk of developing vaginosis include: . Medications, such as antibiotics and steroids . Uncontrolled diabetes . Use of hygiene products such as bubble bath, vaginal spray or vaginal deodorant . Douching . Wearing damp or tight-fitting clothing . Using an intrauterine device (IUD) for birth control . Hormonal changes, such as those associated with pregnancy, birth control pills or menopause . Sexual activity . Having a sexually transmitted infection  Your treatment plan is A single Diflucan (fluconazole) 150mg tablet once.  I have electronically sent this prescription into the pharmacy that you have chosen.  Be sure to take all of the medication as directed. Stop taking any medication if you develop a rash, tongue swelling or shortness of breath. Mothers who are breast feeding should consider pumping and discarding their breast milk while on these antibiotics. However, there is no consensus that infant exposure at these doses would be harmful.  Remember that medication creams can weaken latex  condoms. .   HOME CARE:  Good hygiene may prevent some types of vaginosis from recurring and may relieve some symptoms:  . Avoid baths, hot tubs and whirlpool spas. Rinse soap from your outer genital area after a shower, and dry the area well to prevent irritation. Don't use scented or harsh soaps, such as those with deodorant or antibacterial action. . Avoid irritants. These include scented tampons and pads. . Wipe from front to back after using the toilet. Doing so avoids spreading fecal bacteria to your vagina.  Other things that may help prevent vaginosis include:  . Don't douche. Your vagina doesn't require cleansing other than normal bathing. Repetitive douching disrupts the normal organisms that reside in the vagina and can actually increase your risk of vaginal infection. Douching won't clear up a vaginal infection. . Use a latex condom. Both female and female latex condoms may help you avoid infections spread by sexual contact. . Wear cotton underwear. Also wear pantyhose with a cotton crotch. If you feel comfortable without it, skip wearing underwear to bed. Yeast thrives in moist environments Your symptoms should improve in the next day or two.  GET HELP RIGHT AWAY IF:  . You have pain in your lower abdomen ( pelvic area or over your ovaries) . You develop nausea or vomiting . You develop a fever . Your discharge changes or worsens . You have persistent pain with intercourse . You develop shortness of breath, a rapid pulse, or you faint.  These symptoms could be signs of problems or infections that need to be evaluated by a medical provider now.  MAKE SURE YOU      Understand these instructions.  Will watch your condition.  Will get help right away if you are not doing well or get worse.  Your e-visit answers were reviewed by a board certified advanced clinical practitioner to complete your personal care plan. Depending upon the condition, your plan could have included  both over the counter or prescription medications. Please review your pharmacy choice to make sure that you have choses a pharmacy that is open for you to pick up any needed prescription, Your safety is important to us. If you have drug allergies check your prescription carefully.   You can use MyChart to ask questions about today's visit, request a non-urgent call back, or ask for a work or school excuse for 24 hours related to this e-Visit. If it has been greater than 24 hours you will need to follow up with your provider, or enter a new e-Visit to address those concerns. You will get a MyChart message within the next two days asking about your experience. I hope that your e-visit has been valuable and will speed your recovery.  I spent 5-10 minutes on review and completion of this note- Lydie Stammen PAC  

## 2020-08-24 ENCOUNTER — Institutional Professional Consult (permissible substitution): Payer: 59 | Admitting: Internal Medicine

## 2020-09-05 ENCOUNTER — Ambulatory Visit: Payer: 59

## 2020-09-24 ENCOUNTER — Ambulatory Visit: Payer: 59 | Attending: Internal Medicine

## 2020-09-24 DIAGNOSIS — Z23 Encounter for immunization: Secondary | ICD-10-CM

## 2020-09-24 NOTE — Progress Notes (Signed)
   Covid-19 Vaccination Clinic  Name:  Shelly [facility]    MRN: [id number] DOB: 19-Jan-1966  09/24/2020  [name] was observed post Covid-19 immunization for 15 minutes without incident. She was provided with Vaccine Information Sheet and instruction to access the V-Safe system.   [name] was instructed to call 911 with any severe reactions post vaccine: Marland Kitchen Difficulty breathing  . Swelling of face and throat  . A fast heartbeat  . A bad rash all over body  . Dizziness and weakness   Immunizations Administered    Name Date Dose VIS Date Route   Pfizer COVID-19 Vaccine 09/24/2020  2:38 PM 0.3 mL 07/08/2020 Intramuscular   Manufacturer: [employer], [employer]   Lot: [id number]   NDC: [address]-5

## 2020-10-25 MED FILL — SYNTHROID 88 MCG TABLET: 88 | 90 days supply | Qty: 90 | Fill #1

## 2020-11-23 DIAGNOSIS — Z1159 Encounter for screening for other viral diseases: Secondary | ICD-10-CM | POA: Diagnosis not present

## 2021-01-26 ENCOUNTER — Other Ambulatory Visit: Payer: Self-pay | Admitting: Family Medicine

## 2021-01-27 ENCOUNTER — Other Ambulatory Visit (HOSPITAL_COMMUNITY): Payer: Self-pay

## 2021-01-27 MED ORDER — SYNTHROID 88 MCG PO TABS
88.0000 ug | ORAL_TABLET | Freq: Every day | ORAL | 1 refills | Status: DC
  Filled 2021-01-27: qty 90, 90d supply, fill #0
  Filled 2021-05-05: qty 90, 90d supply, fill #1

## 2021-01-29 ENCOUNTER — Other Ambulatory Visit: Payer: Self-pay | Admitting: Family Medicine

## 2021-01-29 DIAGNOSIS — Z1231 Encounter for screening mammogram for malignant neoplasm of breast: Secondary | ICD-10-CM

## 2021-03-01 ENCOUNTER — Encounter: Payer: Self-pay | Admitting: Family Medicine

## 2021-03-01 ENCOUNTER — Other Ambulatory Visit: Payer: Self-pay

## 2021-03-01 ENCOUNTER — Other Ambulatory Visit (HOSPITAL_COMMUNITY): Payer: Self-pay

## 2021-03-01 ENCOUNTER — Ambulatory Visit (INDEPENDENT_AMBULATORY_CARE_PROVIDER_SITE_OTHER): Payer: 59 | Admitting: Family Medicine

## 2021-03-01 VITALS — BP 118/80 | HR 81 | Temp 97.3°F | Resp 18 | Ht 67.0 in | Wt 148.8 lb

## 2021-03-01 DIAGNOSIS — R232 Flushing: Secondary | ICD-10-CM

## 2021-03-01 DIAGNOSIS — Z114 Encounter for screening for human immunodeficiency virus [HIV]: Secondary | ICD-10-CM

## 2021-03-01 DIAGNOSIS — E890 Postprocedural hypothyroidism: Secondary | ICD-10-CM

## 2021-03-01 DIAGNOSIS — M7061 Trochanteric bursitis, right hip: Secondary | ICD-10-CM

## 2021-03-01 DIAGNOSIS — Z0000 Encounter for general adult medical examination without abnormal findings: Secondary | ICD-10-CM

## 2021-03-01 DIAGNOSIS — E559 Vitamin D deficiency, unspecified: Secondary | ICD-10-CM | POA: Diagnosis not present

## 2021-03-01 DIAGNOSIS — Z1159 Encounter for screening for other viral diseases: Secondary | ICD-10-CM | POA: Diagnosis not present

## 2021-03-01 LAB — BASIC METABOLIC PANEL
BUN: 14 mg/dL (ref 6–23)
CO2: 27 mEq/L (ref 19–32)
Calcium: 10.1 mg/dL (ref 8.4–10.5)
Chloride: 102 mEq/L (ref 96–112)
Creatinine, Ser: 0.92 mg/dL (ref 0.40–1.20)
GFR: 71.17 mL/min (ref >60.00)
Glucose, Bld: 98 mg/dL (ref 70–99)
Potassium: 4.6 mEq/L (ref 3.5–5.1)
Sodium: 140 mEq/L (ref 135–145)

## 2021-03-01 LAB — CBC WITH DIFFERENTIAL/PLATELET
Basophils Absolute: 0 K/uL (ref 0.0–0.1)
Basophils Relative: 0.6 % (ref 0.0–3.0)
Eosinophils Absolute: 0.1 K/uL (ref 0.0–0.7)
Eosinophils Relative: 2.4 % (ref 0.0–5.0)
HCT: 43.8 % (ref 36.0–46.0)
Hemoglobin: 14.4 g/dL (ref 12.0–15.0)
Lymphocytes Relative: 39.8 % (ref 12.0–46.0)
Lymphs Abs: 2 K/uL (ref 0.7–4.0)
MCHC: 32.9 g/dL (ref 30.0–36.0)
MCV: 90.6 fl (ref 78.0–100.0)
Monocytes Absolute: 0.3 K/uL (ref 0.1–1.0)
Monocytes Relative: 5.1 % (ref 3.0–12.0)
Neutro Abs: 2.6 K/uL (ref 1.4–7.7)
Neutrophils Relative %: 52.1 % (ref 43.0–77.0)
Platelets: 378 K/uL (ref 150.0–400.0)
RBC: 4.84 Mil/uL (ref 3.87–5.11)
RDW: 14.1 % (ref 11.5–15.5)
WBC: 5 K/uL (ref 4.0–10.5)

## 2021-03-01 LAB — LIPID PANEL
Cholesterol: 193 mg/dL (ref 0–200)
HDL: 62 mg/dL (ref >39.00)
LDL Cholesterol: 104 mg/dL — ABNORMAL HIGH (ref 0–99)
NonHDL: 131.1
Total CHOL/HDL Ratio: 3
Triglycerides: 136 mg/dL (ref 0.0–149.0)
VLDL: 27.2 mg/dL (ref 0.0–40.0)

## 2021-03-01 LAB — HEPATIC FUNCTION PANEL
ALT: 19 U/L (ref 0–35)
AST: 24 U/L (ref 0–37)
Albumin: 4.7 g/dL (ref 3.5–5.2)
Alkaline Phosphatase: 79 U/L (ref 39–117)
Bilirubin, Direct: 0.1 mg/dL (ref 0.0–0.3)
Total Bilirubin: 0.9 mg/dL (ref 0.2–1.2)
Total Protein: 7.1 g/dL (ref 6.0–8.3)

## 2021-03-01 LAB — VITAMIN D 25 HYDROXY (VIT D DEFICIENCY, FRACTURES): VITD: 68.77 ng/mL (ref 30.00–100.00)

## 2021-03-01 LAB — TSH: TSH: 1.46 u[IU]/mL (ref 0.35–4.50)

## 2021-03-01 MED ORDER — PREDNISONE 10 MG PO TABS
ORAL_TABLET | ORAL | 0 refills | Status: DC
  Filled 2021-03-01 – 2021-03-19 (×2): qty 18, 9d supply, fill #0

## 2021-03-01 NOTE — Assessment & Plan Note (Signed)
New.  Pt's description of pain and location consistent w/ bursitis.  Since she is having accompanying numbness/nerve irritation will start w/ prednisone taper and ice.  If no improvement will need sports med referral.  Pt expressed understanding and is in agreement w/ plan.

## 2021-03-01 NOTE — Assessment & Plan Note (Signed)
Pt has hx of this.  Check labs and replete prn. 

## 2021-03-01 NOTE — Progress Notes (Signed)
Subjective:    Patient ID: Leeanne Rio, female    DOB: 05-Oct-1965, 55 y.o.   MRN: [id number]  HPI CPE- UTD on mammo, pap, colonoscopy.  Due for shingles shot- pt would like to schedule this  Reviewed past medical, surgical, family and social histories.   Health Maintenance  Topic Date Due   Zoster Vaccines- Shingrix (1 of 2) Never done   COVID-19 Vaccine (2 - Pfizer series) 10/15/2020   PAP SMEAR-Modifier  11/01/2020   Hepatitis C Screening  03/01/2022 (Originally 10/26/1985)   HIV Screening  03/01/2022 (Originally 10/26/1982)   INFLUENZA VACCINE  04/19/2021   MAMMOGRAM  12/16/2021   COLONOSCOPY (Pts 45-22yrs Insurance coverage will need to be confirmed)  02/08/2027   TETANUS/TDAP  05/19/2029   Pneumococcal Vaccine 61-40 Years old  Aged Out   HPV VACCINES  Aged Out    Patient Care Team    Relationship Specialty Notifications Start End  Sheliah Hatch, MD PCP - General Family Medicine  02/15/18   Iva Boop, MD Consulting Physician Gastroenterology  02/15/18   Allie Bossier, MD (Inactive) Consulting Physician Obstetrics and Gynecology  02/15/18      Review of Systems Patient reports no vision/ hearing changes, adenopathy,fever, weight change,  persistant/recurrent hoarseness , swallowing issues, chest pain, palpitations, edema, persistant/recurrent cough, hemoptysis, dyspnea (rest/exertional/paroxysmal nocturnal), gastrointestinal bleeding (melena, rectal bleeding), abdominal pain, significant heartburn, bowel changes, GU symptoms (dysuria, hematuria, incontinence), Gyn symptoms (abnormal  bleeding, pain),  syncope, focal weakness, memory loss, numbness & tingling, skin/hair/nail changes, abnormal bruising or bleeding, anxiety, or depression.   R hip pain- pt describes it as a 'constant tooth ache'.  Sxs started a couple of weeks agoWhen she walks, she will develop numbness of R lateral thigh.  Pain is localized to greater trochanteric bursa.  Pain will rarely occur in  groin.  Taking NSAIDs prn w/ some relief.  Hot Flashes- pt reports sxs have been ongoing x3 yrs.  Not currently taking any medication.  This visit occurred during the SARS-CoV-2 public health emergency.  Safety protocols were in place, including screening questions prior to the visit, additional usage of staff PPE, and extensive cleaning of exam room while observing appropriate contact time as indicated for disinfecting solutions.      Objective:   Physical Exam General Appearance:    Alert, cooperative, no distress, appears stated age  Head:    Normocephalic, without obvious abnormality, atraumatic  Eyes:    PERRL, conjunctiva/corneas clear, EOM's intact, fundi    benign, both eyes  Ears:    Normal TM's and external ear canals, both ears  Nose:   Deferred due to COVID  Throat:   Neck:   Supple, symmetrical, trachea midline, no adenopathy;    Thyroid: no enlargement/tenderness/nodules  Back:     Symmetric, no curvature, ROM normal, no CVA tenderness  Lungs:     Clear to auscultation bilaterally, respirations unlabored  Chest Wall:    No tenderness or deformity   Heart:    Regular rate and rhythm, S1 and S2 normal, no murmur, rub   or gallop  Breast Exam:    Deferred to mammo  Abdomen:     Soft, non-tender, bowel sounds active all four quadrants,    no masses, no organomegaly  Genitalia:    Deferred  Rectal:    Extremities:   Extremities normal, atraumatic, no cyanosis or edema  Pulses:   2+ and symmetric all extremities  Skin:   Skin color, texture, turgor  normal, no rashes or lesions  Lymph nodes:   Cervical, supraclavicular, and axillary nodes normal  Neurologic:   CNII-XII intact, normal strength, sensation and reflexes    throughout          Assessment & Plan:

## 2021-03-01 NOTE — Assessment & Plan Note (Signed)
Chronic problem.  Currently on Levothyroxine daily.  Check labs.  Adjust meds prn

## 2021-03-01 NOTE — Assessment & Plan Note (Signed)
Pt's PE WNL.  UTD pap, mammo, colonoscopy.  Check labs.  Anticipatory guidance provided.

## 2021-03-01 NOTE — Assessment & Plan Note (Signed)
New to provider, ongoing for pt.  She reports sxs have been ongoing x3 yrs.  She is not interested in starting SSRI or SNRI at this time.  Encouraged her to start OTC Estroven and monitor for sxs improvement.  Pt expressed understanding and is in agreement w/ plan.

## 2021-03-01 NOTE — Patient Instructions (Addendum)
Follow up in 1 year or as needed Schedule a nurse visit for the shingles shot We'll notify you of your lab results and make any changes if needed Keep up the good work on healthy diet and regular exercise- you look great! Start the Prednisone taper as directed- take w/ food ICE the hip! Start OTC Estroven for hot flashes Call with any questions or concerns- particularly if not improving Enjoy your time off!!!

## 2021-03-02 LAB — HIV ANTIBODY (ROUTINE TESTING W REFLEX): HIV 1&2 Ab, 4th Generation: NONREACTIVE

## 2021-03-02 LAB — HEPATITIS C ANTIBODY
Hepatitis C Ab: NONREACTIVE
SIGNAL TO CUT-OFF: 0 (ref <1.00)

## 2021-03-09 ENCOUNTER — Other Ambulatory Visit (HOSPITAL_COMMUNITY): Payer: Self-pay

## 2021-03-17 ENCOUNTER — Encounter: Payer: Self-pay | Admitting: *Deleted

## 2021-03-19 ENCOUNTER — Other Ambulatory Visit (HOSPITAL_COMMUNITY): Payer: Self-pay

## 2021-03-26 ENCOUNTER — Ambulatory Visit
Admission: RE | Admit: 2021-03-26 | Discharge: 2021-03-26 | Disposition: A | Discharge status: Home / Self Care | Payer: 59 | Source: Ambulatory Visit | Attending: Family Medicine | Admitting: Family Medicine

## 2021-03-26 ENCOUNTER — Other Ambulatory Visit: Payer: Self-pay

## 2021-03-26 DIAGNOSIS — Z1231 Encounter for screening mammogram for malignant neoplasm of breast: Secondary | ICD-10-CM | POA: Diagnosis not present

## 2021-05-05 ENCOUNTER — Other Ambulatory Visit (HOSPITAL_COMMUNITY): Payer: Self-pay

## 2021-05-21 ENCOUNTER — Other Ambulatory Visit: Payer: Self-pay

## 2021-05-21 ENCOUNTER — Ambulatory Visit (INDEPENDENT_AMBULATORY_CARE_PROVIDER_SITE_OTHER): Payer: 59 | Admitting: Registered Nurse

## 2021-05-21 DIAGNOSIS — Z23 Encounter for immunization: Secondary | ICD-10-CM | POA: Diagnosis not present

## 2021-06-03 ENCOUNTER — Ambulatory Visit: Payer: 59

## 2021-08-03 ENCOUNTER — Other Ambulatory Visit: Payer: Self-pay | Admitting: Family Medicine

## 2021-08-04 ENCOUNTER — Other Ambulatory Visit (HOSPITAL_COMMUNITY): Payer: Self-pay

## 2021-08-04 MED ORDER — SYNTHROID 88 MCG PO TABS
88.0000 ug | ORAL_TABLET | Freq: Every day | ORAL | 1 refills | Status: DC
  Filled 2021-08-04: qty 90, 90d supply, fill #0
  Filled 2021-10-29: qty 90, 90d supply, fill #1

## 2021-09-16 ENCOUNTER — Other Ambulatory Visit (HOSPITAL_COMMUNITY): Payer: Self-pay

## 2021-09-16 MED ORDER — AMOXICILLIN-POT CLAVULANATE 875-125 MG PO TABS
ORAL_TABLET | ORAL | 1 refills | Status: DC
Start: 2021-09-16 — End: 2021-12-13
  Filled 2021-09-16: qty 20, 10d supply, fill #0

## 2021-10-22 ENCOUNTER — Ambulatory Visit (INDEPENDENT_AMBULATORY_CARE_PROVIDER_SITE_OTHER): Payer: 59 | Admitting: Family Medicine

## 2021-10-22 DIAGNOSIS — Z23 Encounter for immunization: Secondary | ICD-10-CM | POA: Diagnosis not present

## 2021-10-22 NOTE — Progress Notes (Signed)
Shelly James is a 56 y.o. female presents to the office today for 2nd shingles vaccine. Tolerated well  Lenard Simmer

## 2021-10-29 ENCOUNTER — Other Ambulatory Visit (HOSPITAL_COMMUNITY): Payer: Self-pay

## 2021-12-07 NOTE — Progress Notes (Signed)
? ?ANNUAL EXAM ?Patient name: Shelly James MRN [id number]  Date of birth: 09/08/1966 ?Chief Complaint:   ?Annual Exam ? ?History of Present Illness:   ?Shelly James is a 56 y.o. [phone] female being seen today for a routine annual exam.  ? ?Current complaints: None. Has ongoing hot flashes during the day - no night sweats but no change for her. Lost 20 lbs in the last 6-8 months intentionally.  ? ?Patient's last menstrual period was 10/18/2017. ? ? ?The pregnancy intention screening data noted above was reviewed. Potential methods of contraception were discussed. The patient elected to proceed with No data recorded.  ? ?Last pap 2019. Results were: NILM w/ HRHPV negative. H/O abnormal pap: yes in her 55s - had cryo - normal since.  ?Health Maintenance Due  ?Topic Date Due  ? COVID-19 Vaccine (4 - Booster for Pfizer series) 11/19/2020  ? INFLUENZA VACCINE  04/19/2021  ? ?MXR wnl 03/2021.  ? ? ?  03/01/2021  ?  8:08 AM 11/11/2019  ?  9:02 AM 05/20/2019  ? 11:32 AM 02/15/2018  ?  9:58 AM  ?Depression screen PHQ 2/9  ?Decreased Interest 0 0 0 0  ?Down, Depressed, Hopeless 0 0 0 0  ?PHQ - 2 Score 0 0 0 0  ?Altered sleeping 0 0 0 0  ?Tired, decreased energy 0 0 0 0  ?Change in appetite 0 0 0 0  ?Feeling bad or failure about yourself  0 0 0 0  ?Trouble concentrating 0 0 0 0  ?Moving slowly or fidgety/restless 0 0 0 0  ?Suicidal thoughts 0 0 0 0  ?PHQ-9 Score 0 0 0 0  ?Difficult doing work/chores Not difficult at all Not difficult at all Not difficult at all Not difficult at all  ? ?  ?   ? View : No data to display.  ?  ?  ?  ? ? ? ?Review of Systems:   ?Pertinent items are noted in HPI ?Denies any headaches, blurred vision, fatigue, shortness of breath, chest pain, abdominal pain, abnormal vaginal discharge/itching/odor/irritation, problems with periods, bowel movements, urination, or intercourse unless otherwise stated above.  ?Pertinent History Reviewed:  ?Reviewed past medical,surgical, social and family  history.  ?Reviewed problem list, medications and allergies. ?Physical Assessment:  ? ?Vitals:  ? 12/13/21 0853  ?BP: 110/74  ?Pulse: 75  ?Weight: 134 lb (60.8 kg)  ?Height: 5\' 7"  (1.702 m)  ?Body mass index is 20.99 kg/m?. ?  ?Physical Examination:  ?General appearance - well appearing, and in no distress ?Mental status - alert, oriented to person, place, and time ?Psych:  She has a normal mood and affect ?Skin - warm and dry, normal color, no suspicious lesions noted ?Chest - effort normal, all lung fields clear to auscultation bilaterally ?Heart - normal rate and regular rhythm ?Neck:  midline trachea, no thyromegaly or nodules ?Breasts - breasts appear normal, no suspicious masses, no skin or nipple changes or  axillary nodes ?Abdomen - soft, nontender, nondistended, no masses or organomegaly ?Pelvic -  ?VULVA: normal appearing vulva with no masses, tenderness or lesions   ?VAGINA: normal appearing vagina with normal color and discharge, no lesions   ?CERVIX: normal appearing cervix without discharge or lesions, no CMT ?UTERUS: uterus is felt to be normal size, shape, consistency and nontender  ?ADNEXA: No adnexal masses or tenderness noted. ?Extremities:  No swelling or varicosities noted ? ?Chaperone present for exam ? ?No results found for this or any previous visit (from the  past 24 hour(s)).  ?Assessment & Plan:  ?Oakley was seen today for annual exam. ? ?Diagnoses and all orders for this visit: ? ?Encounter for annual routine gynecological examination ?-     Cytology - PAP( [place]) ?-     MM Digital Screening; Future ?-     Lipid Profile ?-     Basic metabolic panel ?-     TSH ? ?- Cervical cancer screening: Discussed guidelines. Pap with HPV done  ?- Breast Health: Encouraged self breast awareness/SBE. Discussed limits of clinical breast exam for detecting breast cancer. Discussed importance of annual MXR.  Rx given ?- Climacteric/Sexual health: Reviewed typical and atypical symptoms of  menopause/peri-menopause. Discussed PMB and to call if any amount of spotting.  ?- Bone Health: Calcium via diet and supplementation. Discussed weight bearing exercise. DEXA due by age 54-60 ?- Colonoscopy: up to date ?- F/U 12 months and prn  ? ? ? ?Orders Placed This Encounter  ?Procedures  ? MM Digital Screening  ? Lipid Profile  ? Basic metabolic panel  ? TSH  ? ? ?Meds: No orders of the defined types were placed in this encounter. ? ? ?Follow-up: Return in about 1 year (around 12/14/2022) for annual. ? ?Milas Hock, MD ?12/13/2021 ?9:38 AM ?

## 2021-12-09 ENCOUNTER — Telehealth: Payer: Self-pay | Admitting: *Deleted

## 2021-12-09 NOTE — Telephone Encounter (Signed)
Left patient a message with appointment information. 

## 2021-12-13 ENCOUNTER — Ambulatory Visit (INDEPENDENT_AMBULATORY_CARE_PROVIDER_SITE_OTHER): Payer: 59 | Admitting: Obstetrics and Gynecology

## 2021-12-13 ENCOUNTER — Other Ambulatory Visit: Payer: Self-pay

## 2021-12-13 ENCOUNTER — Other Ambulatory Visit (HOSPITAL_COMMUNITY)
Admission: RE | Admit: 2021-12-13 | Discharge: 2021-12-13 | Disposition: A | Discharge status: Home / Self Care | Payer: 59 | Source: Ambulatory Visit | Attending: Obstetrics and Gynecology | Admitting: Obstetrics and Gynecology

## 2021-12-13 ENCOUNTER — Telehealth: Payer: Self-pay | Admitting: *Deleted

## 2021-12-13 ENCOUNTER — Encounter: Payer: Self-pay | Admitting: Obstetrics and Gynecology

## 2021-12-13 VITALS — BP 110/74 | HR 75 | Ht 67.0 in | Wt 134.0 lb

## 2021-12-13 DIAGNOSIS — Z01419 Encounter for gynecological examination (general) (routine) without abnormal findings: Secondary | ICD-10-CM | POA: Diagnosis not present

## 2021-12-13 NOTE — Telephone Encounter (Signed)
Left patient a message to call the office to give spouse's phone number, not able to make out all the numbers on DPR form. ?

## 2021-12-14 LAB — BASIC METABOLIC PANEL
BUN: 18 mg/dL (ref 7–25)
CO2: 27 mmol/L (ref 20–32)
Calcium: 9.3 mg/dL (ref 8.6–10.4)
Chloride: 105 mmol/L (ref 98–110)
Creat: 0.92 mg/dL (ref 0.50–1.03)
Glucose, Bld: 115 mg/dL (ref 65–139)
Potassium: 4.1 mmol/L (ref 3.5–5.3)
Sodium: 139 mmol/L (ref 135–146)

## 2021-12-14 LAB — LIPID PANEL
Cholesterol: 165 mg/dL (ref <200)
HDL: 70 mg/dL (ref >=50)
LDL Cholesterol (Calc): 80 mg/dL (calc)
Non-HDL Cholesterol (Calc): 95 mg/dL (calc) (ref <130)
Total CHOL/HDL Ratio: 2.4 (calc) (ref <5.0)
Triglycerides: 74 mg/dL (ref <150)

## 2021-12-14 LAB — CYTOLOGY - PAP
Comment: NEGATIVE
Diagnosis: NEGATIVE
High risk HPV: NEGATIVE

## 2021-12-14 LAB — TSH: TSH: 2.91 mIU/L

## 2022-01-07 ENCOUNTER — Ambulatory Visit (HOSPITAL_BASED_OUTPATIENT_CLINIC_OR_DEPARTMENT_OTHER): Payer: 59 | Admitting: Orthopaedic Surgery

## 2022-01-14 ENCOUNTER — Ambulatory Visit (HOSPITAL_BASED_OUTPATIENT_CLINIC_OR_DEPARTMENT_OTHER): Payer: 59 | Admitting: Orthopaedic Surgery

## 2022-01-14 ENCOUNTER — Other Ambulatory Visit (HOSPITAL_BASED_OUTPATIENT_CLINIC_OR_DEPARTMENT_OTHER): Payer: Self-pay | Admitting: Orthopaedic Surgery

## 2022-01-14 ENCOUNTER — Ambulatory Visit (HOSPITAL_BASED_OUTPATIENT_CLINIC_OR_DEPARTMENT_OTHER)
Admission: RE | Admit: 2022-01-14 | Discharge: 2022-01-14 | Disposition: A | Discharge status: Home / Self Care | Payer: 59 | Source: Ambulatory Visit | Attending: Orthopaedic Surgery | Admitting: Orthopaedic Surgery

## 2022-01-14 DIAGNOSIS — M542 Cervicalgia: Secondary | ICD-10-CM | POA: Diagnosis not present

## 2022-01-14 NOTE — Progress Notes (Signed)
? ?                            ? ? ?Chief Complaint: Right-sided neck pain ?  ? ? ?History of Present Illness:  ? ? ?Shelly James is a 56 y.o. female presents with cervical pain with radiation to the right upper trapezius which has been going on now for several months.  She states that this will awaken her up at night.  She does take Tylenol which helps.  She states that she feels popping and catching in the neck.  She has been doing massage does help significantly.  Denies any numbness or weakness in the upper extremity ? ? ? ?Surgical History:   ?None ? ?PMH/PSH/Family History/Social History/Meds/Allergies:   ? ?Past Medical History:  ?Diagnosis Date  ? Allergy   ? Asthma   ? Graves disease   ? Hypothyroidism, postablative   ? Urticaria   ? Vaginal Pap smear, abnormal   ? ?Past Surgical History:  ?Procedure Laterality Date  ? COLONOSCOPY    ? ESOPHAGOGASTRODUODENOSCOPY    ? LEEP    ? TONSILLECTOMY AND ADENOIDECTOMY    ? wisdom    ? ?Social History  ? ?Socioeconomic History  ? Marital status: Married  ?  Spouse name: Not on file  ? Number of children: 2  ? Years of education: Not on file  ? Highest education level: Not on file  ?Occupational History  ? Occupation: Conservator, museum/gallery  ?  Employer: [employer]  ?Tobacco Use  ? Smoking status: Never  ? Smokeless tobacco: Never  ?Vaping Use  ? Vaping Use: Never used  ?Substance and Sexual Activity  ? Alcohol use: Yes  ?  Comment: occ  ? Drug use: No  ? Sexual activity: Yes  ?  Partners: Male  ?  Birth control/protection: Post-menopausal  ?Other Topics Concern  ? Not on file  ?Social History Narrative  ? Married, 2 sons  ? 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 1-2 caffeinated beverages daily  ? 12/27/2016  ? ?Social Determinants of Health  ? ?Financial Resource Strain: Not on file  ?Food Insecurity: Not on file  ?Transportation Needs: Not on file  ?Physical Activity: Not on file  ?Stress: Not on file   ?Social Connections: Not on file  ? ?Family History  ?Problem Relation Age of Onset  ? Celiac disease Sister   ? Raynaud syndrome Sister   ? Cancer Father   ?     mesothelioma  ? Thyroid disease Maternal Grandmother   ? Allergic rhinitis Neg Hx   ? Angioedema Neg Hx   ? Asthma Neg Hx   ? Immunodeficiency Neg Hx   ? Urticaria Neg Hx   ? Eczema Neg Hx   ? Colon cancer Neg Hx   ? Stomach cancer Neg Hx   ? ?Allergies  ?Allergen Reactions  ? Hydrocodone Hives  ? ?Current Outpatient Medications  ?Medication Sig Dispense Refill  ? cetirizine (ZYRTEC) 10 MG tablet Take 10 mg by mouth daily.    ? SYNTHROID 88 MCG tablet TAKE 1 TABLET BY MOUTH DAILY (Patient taking differently: Take 110 mcg by mouth daily.) 90 tablet 1  ? ?No current facility-administered medications for this visit.  ? ?No results found. ? ?Review of Systems:   ?A ROS was performed including pertinent positives and negatives as documented in the HPI. ? ?Physical Exam :   ?  Constitutional: NAD and appears stated age ?Neurological: Alert and oriented ?Psych: Appropriate affect and cooperative ?Last menstrual period 10/18/2017.  ? ?Comprehensive Musculoskeletal Exam:   ? ?Tenderness palpation about the right peritrapezial muscles of the neck as well as midline and upper trapezius.  She has no weakness with upper extremity strength testing.  Sensation is intact distally pain with rotation of the neck. ? ?Imaging:   ?Xray (4 view cervical spine): ?Degenerative disc disease at C5-C6 level with disc base narrowing and osteophytes ? ? ? ?I personally reviewed and interpreted the radiographs. ? ? ?Assessment:   ?56 y.o. female with degenerative disc disease but without any type of radiculopathic findings.  At this time I have counseled her on specific exercises and interventions.  I would like to refer her for physical therapy for neck strengthening program as well as possible candidate for future dry needling.  She may continue to perform massages.  I have showed her  hip specific cervical traction type device which can also help with flareups. ? ?Plan :   ? ?-She will call back as needed should she want an additional prescription for Robaxin or to be seen again ? ? ? ? ?I personally saw and evaluated the patient, and participated in the management and treatment plan. ? ?Huel Cote, MD ?Attending Physician, Orthopedic Surgery ? ?This document was dictated using Conservation officer, historic buildings. A reasonable attempt at proof reading has been made to minimize errors. ?

## 2022-02-05 ENCOUNTER — Other Ambulatory Visit: Payer: Self-pay | Admitting: Family Medicine

## 2022-02-07 ENCOUNTER — Other Ambulatory Visit (HOSPITAL_COMMUNITY): Payer: Self-pay

## 2022-02-07 MED ORDER — SYNTHROID 88 MCG PO TABS
88.0000 ug | ORAL_TABLET | Freq: Every day | ORAL | 0 refills | Status: DC
  Filled 2022-02-07: qty 90, 90d supply, fill #0

## 2022-04-26 ENCOUNTER — Ambulatory Visit: Payer: 59

## 2022-05-06 ENCOUNTER — Ambulatory Visit
Admission: RE | Admit: 2022-05-06 | Discharge: 2022-05-06 | Disposition: A | Discharge status: Home / Self Care | Payer: 59 | Source: Ambulatory Visit | Attending: Obstetrics and Gynecology | Admitting: Obstetrics and Gynecology

## 2022-05-06 DIAGNOSIS — Z01419 Encounter for gynecological examination (general) (routine) without abnormal findings: Secondary | ICD-10-CM

## 2022-05-06 DIAGNOSIS — Z1231 Encounter for screening mammogram for malignant neoplasm of breast: Secondary | ICD-10-CM | POA: Diagnosis not present

## 2022-05-09 ENCOUNTER — Other Ambulatory Visit: Payer: Self-pay | Admitting: Family Medicine

## 2022-05-11 ENCOUNTER — Encounter: Payer: Self-pay | Admitting: Family Medicine

## 2022-05-11 ENCOUNTER — Other Ambulatory Visit (HOSPITAL_COMMUNITY): Payer: Self-pay

## 2022-05-12 ENCOUNTER — Other Ambulatory Visit (HOSPITAL_COMMUNITY): Payer: Self-pay

## 2022-05-12 MED ORDER — SYNTHROID 88 MCG PO TABS
88.0000 ug | ORAL_TABLET | Freq: Every day | ORAL | 0 refills | Status: DC
  Filled 2022-05-12: qty 90, 90d supply, fill #0

## 2022-05-13 ENCOUNTER — Other Ambulatory Visit (HOSPITAL_COMMUNITY): Payer: Self-pay

## 2022-07-25 DIAGNOSIS — H524 Presbyopia: Secondary | ICD-10-CM | POA: Diagnosis not present

## 2022-08-15 ENCOUNTER — Other Ambulatory Visit: Payer: Self-pay | Admitting: Family Medicine

## 2022-08-15 ENCOUNTER — Other Ambulatory Visit (HOSPITAL_COMMUNITY): Payer: Self-pay

## 2022-08-15 ENCOUNTER — Other Ambulatory Visit (HOSPITAL_BASED_OUTPATIENT_CLINIC_OR_DEPARTMENT_OTHER): Payer: Self-pay

## 2022-08-15 MED ORDER — SYNTHROID 88 MCG PO TABS
88.0000 ug | ORAL_TABLET | Freq: Every day | ORAL | 0 refills | Status: DC
  Filled 2022-08-15: qty 90, 90d supply, fill #0

## 2022-08-23 ENCOUNTER — Other Ambulatory Visit (HOSPITAL_COMMUNITY): Payer: Self-pay

## 2022-08-23 ENCOUNTER — Encounter (HOSPITAL_COMMUNITY): Payer: Self-pay

## 2022-08-24 ENCOUNTER — Other Ambulatory Visit (HOSPITAL_COMMUNITY): Payer: Self-pay

## 2022-08-25 ENCOUNTER — Other Ambulatory Visit (HOSPITAL_COMMUNITY): Payer: Self-pay

## 2022-08-26 ENCOUNTER — Other Ambulatory Visit (HOSPITAL_COMMUNITY): Payer: Self-pay

## 2022-11-16 IMAGING — DX DG CERVICAL SPINE COMPLETE 4+V
4 series · 4 of 4 positions shown · non-contrast
Comparison: None.

CLINICAL DATA: Neck pain x 6 weeks.

EXAM:
CERVICAL SPINE - COMPLETE 4 VIEW

[c-spine ap]
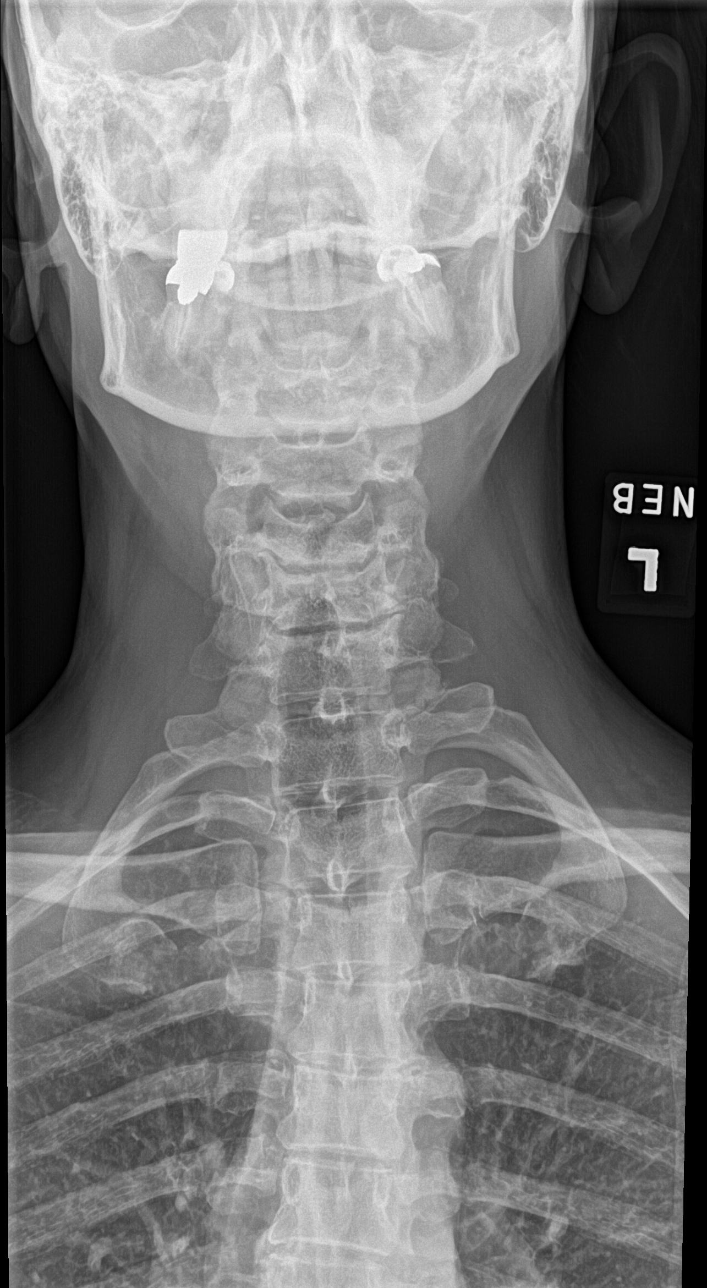

[c-spine lat]
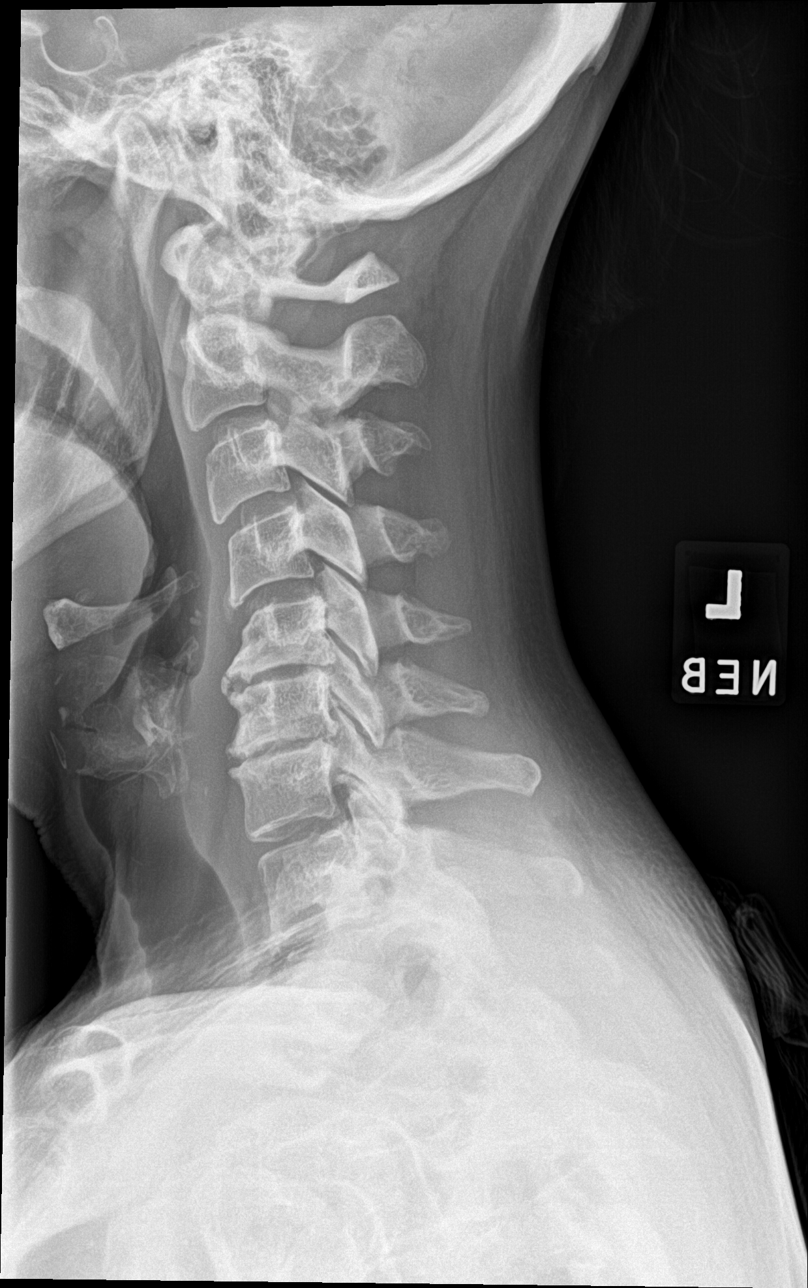

[c-spine flex]
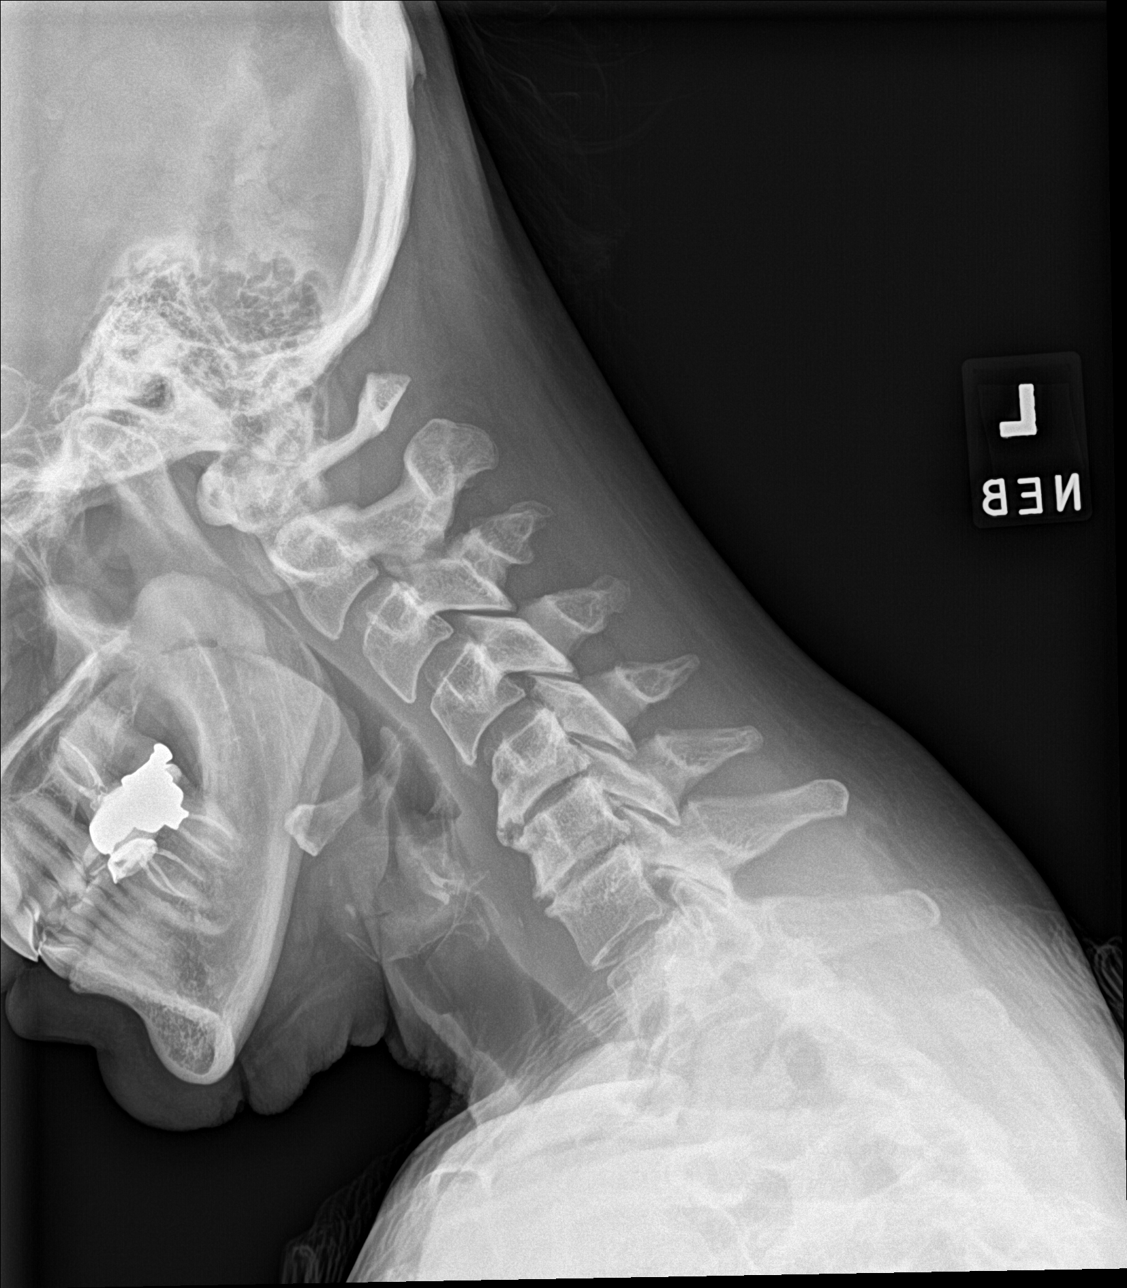

[c-spine ext]
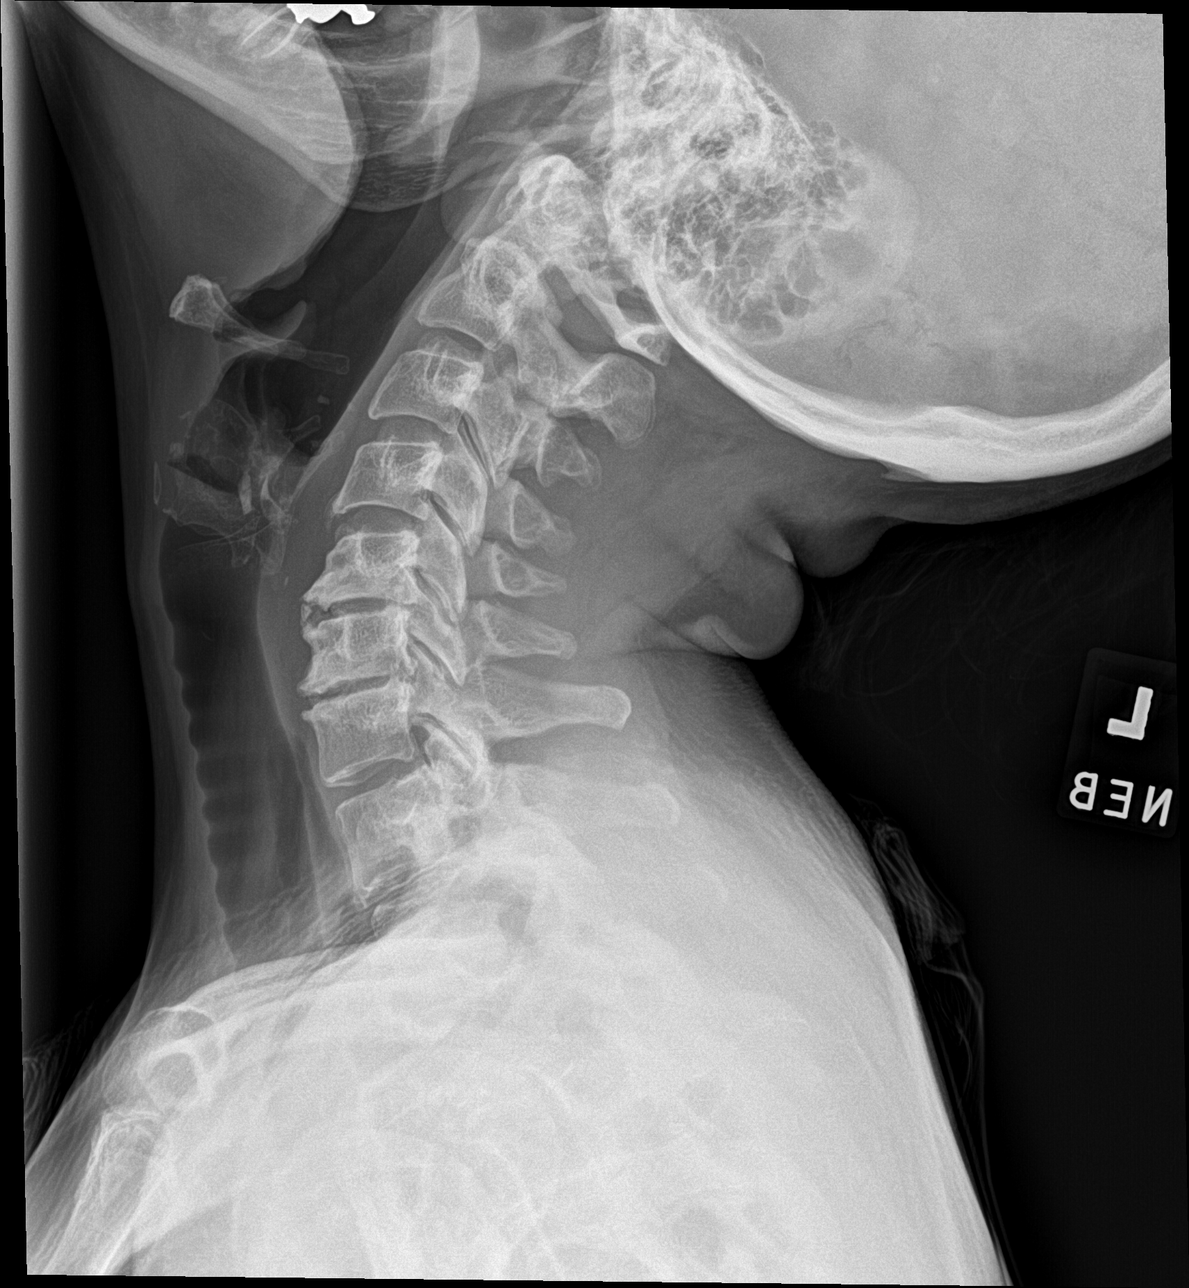

[4 of 4 positions shown; findings below may reference images not displayed]

FINDINGS: No recent fracture is seen. Degenerative changes are noted with bony
spurs from C4-C7 levels. There is disc space narrowing at C5-C6 and
C6-C7 levels. In the AP view, there is encroachment of neural
foramina from C5-C7 levels more so on the right side at C5-C6 level
and on the left side at C6-C7 level. There is possible minimal
encroachment of neural foramina at C4-C5 level. Alignment of
posterior margins of vertebral bodies does not change between
flexion and extension lateral views.
IMPRESSION: No recent fracture is seen. Alignment of posterior margins of
vertebral bodies is unremarkable. Alignment does not change between
flexion and extension lateral views. Cervical spondylosis with
encroachment of neural foramina more severe on the right side at
C5-C6 level and on the left side at C6-C7 level.

## 2022-11-29 ENCOUNTER — Other Ambulatory Visit (HOSPITAL_COMMUNITY): Payer: Self-pay

## 2022-11-29 ENCOUNTER — Other Ambulatory Visit: Payer: Self-pay | Admitting: Family Medicine

## 2022-11-29 MED ORDER — SYNTHROID 88 MCG PO TABS
88.0000 ug | ORAL_TABLET | Freq: Every day | ORAL | 0 refills | Status: DC
  Filled 2022-11-29: qty 90, 90d supply, fill #0

## 2023-01-13 ENCOUNTER — Encounter: Payer: Commercial Managed Care - PPO | Admitting: Family Medicine

## 2023-01-19 ENCOUNTER — Other Ambulatory Visit (HOSPITAL_COMMUNITY): Payer: Self-pay

## 2023-01-19 ENCOUNTER — Ambulatory Visit (INDEPENDENT_AMBULATORY_CARE_PROVIDER_SITE_OTHER): Payer: Commercial Managed Care - PPO | Admitting: Family Medicine

## 2023-01-19 ENCOUNTER — Encounter: Payer: Self-pay | Admitting: Family Medicine

## 2023-01-19 VITALS — BP 116/60 | HR 78 | Temp 98.4°F | Ht 66.0 in | Wt 134.6 lb

## 2023-01-19 DIAGNOSIS — R0789 Other chest pain: Secondary | ICD-10-CM | POA: Diagnosis not present

## 2023-01-19 MED ORDER — OMEPRAZOLE 40 MG PO CPDR
40.0000 mg | DELAYED_RELEASE_CAPSULE | Freq: Every day | ORAL | 3 refills | Status: DC
  Filled 2023-01-19: qty 30, 30d supply, fill #0
  Filled 2023-02-17: qty 30, 30d supply, fill #1
  Filled 2023-02-28 – 2023-05-26 (×2): qty 30, 30d supply, fill #2
  Filled 2023-07-19: qty 30, 30d supply, fill #3

## 2023-01-19 NOTE — Patient Instructions (Signed)
Schedule your complete physical in 6 months We'll notify you of your lab results and make any changes if needed START the Omeprazole daily We'll call you to schedule your cardiology appt Call with any questions or concerns Hang in there!!

## 2023-01-19 NOTE — Progress Notes (Signed)
   Subjective:    Patient ID: Shelly James, female    DOB: 1966-06-18, 57 y.o.   MRN: [id number]  HPI Chest pain- pt reports 3 separate occasions in which she has woke up due to chest heaviness.  'i'm sound asleep and then sit up like something was sitting on my chest'.  She doesn't feel that it's indigestion- doesn't need to burp.  Pt is able to exercise without difficulty- no CP, SOB.  Pressure will resolve spontaneously after about 20 minutes of sitting up.  No associated SOB, nausea.  No family hx of heart disease.  All 3 episodes are substernal and have occurred in the last 6 months.  Taking daily acid reducer.   Review of Systems For ROS see HPI     Objective:   Physical Exam Vitals reviewed.  Constitutional:      General: She is not in acute distress.    Appearance: She is well-developed.  HENT:     Head: Normocephalic and atraumatic.  Eyes:     Conjunctiva/sclera: Conjunctivae normal.     Pupils: Pupils are equal, round, and reactive to light.  Neck:     Thyroid: No thyromegaly.  Cardiovascular:     Rate and Rhythm: Normal rate and regular rhythm.     Heart sounds: Normal heart sounds. No murmur heard. Pulmonary:     Effort: Pulmonary effort is normal. No respiratory distress.     Breath sounds: Normal breath sounds.  Abdominal:     General: There is no distension.     Palpations: Abdomen is soft.     Tenderness: There is no abdominal tenderness.  Musculoskeletal:     Cervical back: Normal range of motion and neck supple.  Lymphadenopathy:     Cervical: No cervical adenopathy.  Skin:    General: Skin is warm and dry.  Neurological:     Mental Status: She is alert and oriented to person, place, and time.  Psychiatric:        Behavior: Behavior normal.           Assessment & Plan:  Chest pressure- new.  Pt w/ 3 separate episodes of substernal chest pressure that wakes her from sleep in the last 6 months.  She denies indigestion or reflux.  Takes a daily  acid reducer.  Is able to exercise w/o difficulty- no chest pressure or SOB.  EKG today WNL.  Will check labs to risk stratify.  Will increase potency of acid reducer and start Omeprazole daily.  Refer to cardiology for complete evaluation.  Pt expressed understanding and is in agreement w/ plan.

## 2023-01-20 LAB — HEPATIC FUNCTION PANEL
ALT: 17 U/L (ref 0–35)
AST: 27 U/L (ref 0–37)
Albumin: 4.5 g/dL (ref 3.5–5.2)
Alkaline Phosphatase: 59 U/L (ref 39–117)
Bilirubin, Direct: 0.1 mg/dL (ref 0.0–0.3)
Total Bilirubin: 0.7 mg/dL (ref 0.2–1.2)
Total Protein: 7.4 g/dL (ref 6.0–8.3)

## 2023-01-20 LAB — CBC WITH DIFFERENTIAL/PLATELET
Basophils Absolute: 0.1 10*3/uL (ref 0.0–0.1)
Basophils Relative: 1.1 % (ref 0.0–3.0)
Eosinophils Absolute: 0.2 10*3/uL (ref 0.0–0.7)
Eosinophils Relative: 3.1 % (ref 0.0–5.0)
HCT: 43.6 % (ref 36.0–46.0)
Hemoglobin: 14.8 g/dL (ref 12.0–15.0)
Lymphocytes Relative: 39.8 % (ref 12.0–46.0)
Lymphs Abs: 2.2 10*3/uL (ref 0.7–4.0)
MCHC: 34 g/dL (ref 30.0–36.0)
MCV: 92.6 fl (ref 78.0–100.0)
Monocytes Absolute: 0.4 10*3/uL (ref 0.1–1.0)
Monocytes Relative: 6.8 % (ref 3.0–12.0)
Neutro Abs: 2.7 10*3/uL (ref 1.4–7.7)
Neutrophils Relative %: 49.2 % (ref 43.0–77.0)
Platelets: 328 10*3/uL (ref 150.0–400.0)
RBC: 4.71 Mil/uL (ref 3.87–5.11)
RDW: 13.2 % (ref 11.5–15.5)
WBC: 5.5 10*3/uL (ref 4.0–10.5)

## 2023-01-20 LAB — LIPID PANEL
Cholesterol: 216 mg/dL — ABNORMAL HIGH (ref 0–200)
HDL: 79.1 mg/dL (ref >39.00)
LDL Cholesterol: 104 mg/dL — ABNORMAL HIGH (ref 0–99)
NonHDL: 136.42
Total CHOL/HDL Ratio: 3
Triglycerides: 164 mg/dL — ABNORMAL HIGH (ref 0.0–149.0)
VLDL: 32.8 mg/dL (ref 0.0–40.0)

## 2023-01-20 LAB — BASIC METABOLIC PANEL
BUN: 23 mg/dL (ref 6–23)
CO2: 26 mEq/L (ref 19–32)
Calcium: 9.9 mg/dL (ref 8.4–10.5)
Chloride: 102 mEq/L (ref 96–112)
Creatinine, Ser: 0.79 mg/dL (ref 0.40–1.20)
GFR: 84.32 mL/min (ref >60.00)
Glucose, Bld: 76 mg/dL (ref 70–99)
Potassium: 4.1 mEq/L (ref 3.5–5.1)
Sodium: 138 mEq/L (ref 135–145)

## 2023-01-20 LAB — TSH: TSH: 2.58 u[IU]/mL (ref 0.35–5.50)

## 2023-01-23 ENCOUNTER — Other Ambulatory Visit (HOSPITAL_COMMUNITY): Payer: Self-pay

## 2023-01-23 ENCOUNTER — Telehealth: Payer: Self-pay

## 2023-01-23 ENCOUNTER — Encounter: Payer: Self-pay | Admitting: Cardiology

## 2023-01-23 ENCOUNTER — Ambulatory Visit: Payer: Commercial Managed Care - PPO | Attending: Cardiology | Admitting: Cardiology

## 2023-01-23 VITALS — BP 114/66 | HR 80 | Ht 66.0 in | Wt 136.6 lb

## 2023-01-23 DIAGNOSIS — Z131 Encounter for screening for diabetes mellitus: Secondary | ICD-10-CM

## 2023-01-23 DIAGNOSIS — E7800 Pure hypercholesterolemia, unspecified: Secondary | ICD-10-CM | POA: Diagnosis not present

## 2023-01-23 DIAGNOSIS — E781 Pure hyperglyceridemia: Secondary | ICD-10-CM

## 2023-01-23 DIAGNOSIS — R079 Chest pain, unspecified: Secondary | ICD-10-CM

## 2023-01-23 DIAGNOSIS — R0789 Other chest pain: Secondary | ICD-10-CM

## 2023-01-23 DIAGNOSIS — Z01812 Encounter for preprocedural laboratory examination: Secondary | ICD-10-CM

## 2023-01-23 DIAGNOSIS — Z79899 Other long term (current) drug therapy: Secondary | ICD-10-CM | POA: Diagnosis not present

## 2023-01-23 LAB — BASIC METABOLIC PANEL
BUN/Creatinine Ratio: 22 (ref 9–23)
BUN: 17 mg/dL (ref 6–24)
CO2: 22 mmol/L (ref 20–29)

## 2023-01-23 LAB — MAGNESIUM: Magnesium: 2.2 mg/dL (ref 1.6–2.3)

## 2023-01-23 LAB — HEMOGLOBIN A1C

## 2023-01-23 MED ORDER — METOPROLOL TARTRATE 100 MG PO TABS
ORAL_TABLET | ORAL | 0 refills | Status: DC
  Filled 2023-01-23: qty 1, 1d supply, fill #0

## 2023-01-23 NOTE — Progress Notes (Signed)
Cardiology Office Note:    Date:  01/25/2023   ID:  Shelly James, DOB [birth year], MRN [id number]  PCP:  Sheliah Hatch, MD  Cardiologist:  Thomasene Ripple, DO  Electrophysiologist:  None   Referring MD: Sheliah Hatch, MD   " I have had this chest pain for some time now"   History of Present Illness:    Shelly James is a 57 y.o. female with a hx of prediabetes, hypercholesteremia, hypertriglyceridemia, hypothyroidism, vitamin D deficiency, GERD here today to establish cardiac care.  She tells me that she has recently started experience some chest discomfort.  She notes that have been going on for several months.  She tells me about 3 occasions where she woke up in the middle the night when she felt significant mid sternal heaviness.  She describes this as in the middle her sleep she wakes up and has to sit up and during this time they feel as if something is sitting on her chest.  It last for few minutes and then resolves.  No associated shortness of breath.  After she was hesitant to complete about this as she thought it would resolve but it continued to persist.  She did see her PCP recently who advised that she see cardiology.  Past Medical History:  Diagnosis Date   Allergy    Asthma    Graves disease    Hypothyroidism, postablative    Urticaria    Vaginal Pap smear, abnormal     Past Surgical History:  Procedure Laterality Date   COLONOSCOPY     ESOPHAGOGASTRODUODENOSCOPY     LEEP     TONSILLECTOMY AND ADENOIDECTOMY     wisdom      Current Medications: Current Meds  Medication Sig   cetirizine (ZYRTEC) 10 MG tablet Take 10 mg by mouth daily.   metoprolol tartrate (LOPRESSOR) 100 MG tablet Take 2 hours prior to CT   omeprazole (PRILOSEC) 40 MG capsule Take 1 capsule (40 mg total) by mouth daily.   SYNTHROID 88 MCG tablet Take 1 tablet (88 mcg total) by mouth daily.     Allergies:   Hydrocodone   Social History   Socioeconomic History    Marital status: Married    Spouse name: Not on file   Number of children: 2   Years of education: Not on file   Highest education level: Not on file  Occupational History   Occupation: Conservator, museum/gallery    Employer: [employer]  Tobacco Use   Smoking status: Never   Smokeless tobacco: Never  Vaping Use   Vaping Use: Never used  Substance and Sexual Activity   Alcohol use: Yes    Comment: occ   Drug use: No   Sexual activity: Yes    Partners: Male    Birth control/protection: Post-menopausal  Other Topics Concern   Not on file  Social History Narrative   Married, 2 sons   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 There  is no history of Allergic rhinitis, Angioedema, Asthma, Immunodeficiency, Urticaria, Eczema, Colon cancer, or Stomach cancer.  ROS:   Review of Systems  Constitution: Negative for decreased appetite, fever and weight gain.  HENT: Negative for congestion, ear discharge, hoarse voice and sore throat.   Eyes: Negative for discharge, redness, vision loss in right eye and visual halos.  Cardiovascular: Reports chest pain.  Negative for, dyspnea on exertion, leg swelling, orthopnea and palpitations.  Respiratory: Negative for cough, hemoptysis, shortness of breath and snoring.   Endocrine: Negative for heat intolerance and polyphagia.  Hematologic/Lymphatic: Negative for bleeding problem. Does not bruise/bleed easily.  Skin: Negative for flushing, nail  changes, rash and suspicious lesions.  Musculoskeletal: Negative for arthritis, joint pain, muscle cramps, myalgias, neck pain and stiffness.  Gastrointestinal: Negative for abdominal pain, bowel incontinence, diarrhea and excessive appetite.  Genitourinary: Negative for decreased libido, genital sores and incomplete emptying.  Neurological: Negative for brief paralysis, focal weakness, headaches and loss of balance.  Psychiatric/Behavioral: Negative for altered mental status, depression and suicidal ideas.  Allergic/Immunologic: Negative for HIV exposure and persistent infections.    EKGs/Labs/Other Studies Reviewed:    The following studies were reviewed today:   EKG:  The ekg ordered today demonstrates   Recent Labs: 01/19/2023: ALT 17; Hemoglobin 14.8; Platelets 328.0; TSH 2.58 01/23/2023: BUN 17; Creatinine, Ser 0.78; Magnesium 2.2; Potassium 4.7; Sodium 139  Recent Lipid Panel    Component Value Date/Time   CHOL 216 (H) 01/19/2023 1320   TRIG 164.0 (H) 01/19/2023 1320   HDL 79.10 01/19/2023 1320   CHOLHDL 3 01/19/2023 1320   VLDL 32.8 01/19/2023 1320   LDLCALC 104 (H) 01/19/2023 1320   LDLCALC 80 12/13/2021 0945    Physical Exam:    VS:  BP 114/66   Pulse 80   Ht 5\' 6"  (1.676 m)   Wt 136 lb 9.6 oz (62 kg)   LMP 10/18/2017   SpO2 97%   BMI 22.05 kg/m     Wt Readings from Last 3 Encounters:  01/23/23 136 lb 9.6 oz (62 kg)  01/19/23 134 lb 9.6 oz (61.1 kg)  12/13/21 134 lb (60.8 kg)     GEN: Well nourished, well developed in no acute distress HEENT: Normal NECK: No JVD; No carotid bruits LYMPHATICS: No lymphadenopathy CARDIAC: S1S2 noted,RRR, no murmurs, rubs, gallops RESPIRATORY:  Clear to auscultation without rales, wheezing or rhonchi  ABDOMEN: Soft, non-tender, non-distended, +bowel sounds, no guarding. EXTREMITIES: No edema, No cyanosis, no clubbing MUSCULOSKELETAL:  No deformity  SKIN: Warm and dry NEUROLOGIC:  Alert and oriented x 3,  non-focal PSYCHIATRIC:  Normal affect, good insight  ASSESSMENT:    1. Pre-procedure lab exam   2. Screening for diabetes mellitus (DM)   3. Medication management   4. Chest pressure   5. Chest pain, unspecified type   6. Hypercholesteremia   7. Hypertriglyceridemia    PLAN:     The symptoms chest pain is concerning, this patient does have intermediate risk for coronary artery disease and at this time I would like to pursue an ischemic evaluation in this patient.  Shared decision a coronary CTA at this time is appropriate.  I have discussed with the patient about the testing.  The patient has no IV contrast allergy and is agreeable to proceed with this test.  Reviewing her lipid profile.  It would be beneficial to start low-dose Crestor 5 mg daily.  She is agreeable for that. Will repeat that blood work in [redacted] weeks along  with LP(a).  Will get hemoglobin A1c with history of prediabetes.  She does have a healthy diet she plans to continue this.  The patient is in agreement with the above plan. The patient left the office in stable condition.  The patient will follow up in 16 weeks or sooner if needed.   Medication Adjustments/Labs and Tests Ordered: Current medicines are reviewed at length with the patient today.  Concerns regarding medicines are outlined above.  Orders Placed This Encounter  Procedures   CT CORONARY MORPH W/CTA COR W/SCORE W/CA W/CM &/OR WO/CM   HgB A1c   Basic Metabolic Panel (BMET)   Lipoprotein A (LPA)   Magnesium   Lipid panel   Meds ordered this encounter  Medications   metoprolol tartrate (LOPRESSOR) 100 MG tablet    Sig: Take 2 hours prior to CT    Dispense:  1 tablet    Refill:  0    Patient Instructions  Medication Instructions:  Your physician has recommended you make the following change in your medication:  START: Crestor 5 mg once nighty  *If you need a refill on your cardiac medications before your next appointment, please call your  pharmacy*   Lab Work: Your physician recommends that you have labs drawn: TODAY: Lp(a), BMET, Mag, HgbA1c In 16 weeks: Lipids If you have labs (blood work) drawn today and your tests are completely normal, you will receive your results only by: MyChart Message (if you have MyChart) OR A paper copy in the mail If you have any lab test that is abnormal or we need to change your treatment, we will call you to review the results.   Testing/Procedures:   Your cardiac CT will be scheduled at one of the below locations:   [facility] [address] [place], [place] [address] [phone]   If scheduled at [facility], please arrive at the [facility] and Children's Entrance (Entrance C2) of [facility] 30 minutes prior to test start time. You can use the FREE valet parking offered at entrance C (encouraged to control the heart rate for the test)  Proceed to the [facility] Radiology Department (first floor) to check-in and test prep.  All radiology patients and guests should use entrance C2 at [facility], accessed from [facility], even though the hospital's physical address listed is [address]       Please follow these instructions carefully (unless otherwise directed):   On the Night Before the Test: Be sure to Drink plenty of water. Do not consume any caffeinated/decaffeinated beverages or chocolate 12 hours prior to your test. Do not take any antihistamines 12 hours prior to your test.   On the Day of the Test: Drink plenty of water until 1 hour prior to the test. Do not eat any food 1 hour prior to test. You may take your regular medications prior to the test.  Take metoprolol (Lopressor) two hours prior to test. FEMALES- please wear underwire-free bra if available, avoid dresses & tight clothing.       After the Test: Drink plenty of water. After receiving IV contrast, you may experience a mild flushed  feeling. This is normal. On occasion, you may experience a mild rash up to 24 hours after the test. This is not dangerous. If this occurs, you can take Benadryl 25 mg and increase your fluid intake. If you experience trouble breathing, this can be serious. If it is severe call 911  IMMEDIATELY. If it is mild, please call our office. If you take any of these medications: Glipizide/Metformin, Avandament, Glucavance, please do not take 48 hours after completing test unless otherwise instructed.  We will call to schedule your test 2-4 weeks out understanding that some insurance companies will need an authorization prior to the service being performed.   For non-scheduling related questions, please contact the cardiac imaging nurse navigator should you have any questions/concerns: Rockwell Alexandria, Cardiac Imaging Nurse Navigator Larey Brick, Cardiac Imaging Nurse Navigator [facility] Direct Office Dial: [phone]   For scheduling needs, including cancellations and rescheduling, please call [place], [phone].    Follow-Up: At [facility], you and your health needs are our priority.  As part of our continuing mission to provide you with exceptional heart care, we have created designated Provider Care Teams.  These Care Teams include your primary Cardiologist (physician) and Advanced Practice Providers (APPs -  Physician Assistants and Nurse Practitioners) who all work together to provide you with the care you need, when you need it.  Your next appointment:   16 week(s)  Provider:   Thomasene Ripple, DO    Adopting a Healthy Lifestyle.  Know what a healthy weight is for you (roughly BMI <25) and aim to maintain this   Aim for 7+ servings of fruits and vegetables daily   65-80+ fluid ounces of water or unsweet tea for healthy kidneys   Limit to max 1 drink of alcohol per day; avoid smoking/tobacco   Limit animal fats in diet for cholesterol and heart  health - choose grass fed whenever available   Avoid highly processed foods, and foods high in saturated/trans fats   Aim for low stress - take time to unwind and care for your mental health   Aim for 150 min of moderate intensity exercise weekly for heart health, and weights twice weekly for bone health   Aim for 7-9 hours of sleep daily   When it comes to diets, agreement about the perfect plan isnt easy to find, even among the experts. Experts at the [facility] developed an idea known as the Healthy Eating Plate. Just imagine a plate divided into logical, healthy portions.   The emphasis is on diet quality:   Load up on vegetables and fruits - one-half of your plate: Aim for color and variety, and remember that potatoes dont count.   Go for whole grains - one-quarter of your plate: Whole wheat, barley, wheat berries, quinoa, oats, brown rice, and foods made with them. If you want pasta, go with whole wheat pasta.   Protein power - one-quarter of your plate: Fish, chicken, beans, and nuts are all healthy, versatile protein sources. Limit red meat.   The diet, however, does go beyond the plate, offering a few other suggestions.   Use healthy plant oils, such as olive, canola, soy, corn, sunflower and peanut. Check the labels, and avoid partially hydrogenated oil, which have unhealthy trans fats.   If youre thirsty, drink water. Coffee and tea are good in moderation, but skip sugary drinks and limit milk and dairy products to one or two daily servings.   The type of carbohydrate in the diet is more important than the amount. Some sources of carbohydrates, such as vegetables, fruits, whole grains, and beans-are healthier than others.   Finally, stay active  Signed, Thomasene Ripple, DO  01/25/2023 9:58 AM    [facility]

## 2023-01-23 NOTE — Telephone Encounter (Signed)
-----   Message from Sheliah Hatch, MD sent at 01/23/2023  7:27 AM EDT ----- Total cholesterol, triglycerides (fatty part of blood) have both increased but your LDL (bad cholesterol) is good and your HDL (good cholesterol) is awesome!  No changes at this time.  Remainder of labs look great!

## 2023-01-23 NOTE — Telephone Encounter (Signed)
Left pt a VM with labs

## 2023-01-23 NOTE — Patient Instructions (Addendum)
Medication Instructions:  Your physician has recommended you make the following change in your medication:  START: Crestor 5 mg once nighty  *If you need a refill on your cardiac medications before your next appointment, please call your pharmacy*   Lab Work: Your physician recommends that you have labs drawn: TODAY: Lp(a), BMET, Mag, HgbA1c In 16 weeks: Lipids If you have labs (blood work) drawn today and your tests are completely normal, you will receive your results only by: MyChart Message (if you have MyChart) OR A paper copy in the mail If you have any lab test that is abnormal or we need to change your treatment, we will call you to review the results.   Testing/Procedures:   Your cardiac CT will be scheduled at one of the below locations:   [facility] [address] [place], [place] [address] [phone]   If scheduled at [facility], please arrive at the [facility] and Children's Entrance (Entrance C2) of [facility] 30 minutes prior to test start time. You can use the FREE valet parking offered at entrance C (encouraged to control the heart rate for the test)  Proceed to the [facility] Radiology Department (first floor) to check-in and test prep.  All radiology patients and guests should use entrance C2 at [facility], accessed from [facility], even though the hospital's physical address listed is [address].       Please follow these instructions carefully (unless otherwise directed):   On the Night Before the Test: Be sure to Drink plenty of water. Do not consume any caffeinated/decaffeinated beverages or chocolate 12 hours prior to your test. Do not take any antihistamines 12 hours prior to your test.   On the Day of the Test: Drink plenty of water until 1 hour prior to the test. Do not eat any food 1 hour prior to test. You may take your regular medications prior to the test.  Take  metoprolol (Lopressor) two hours prior to test. FEMALES- please wear underwire-free bra if available, avoid dresses & tight clothing.       After the Test: Drink plenty of water. After receiving IV contrast, you may experience a mild flushed feeling. This is normal. On occasion, you may experience a mild rash up to 24 hours after the test. This is not dangerous. If this occurs, you can take Benadryl 25 mg and increase your fluid intake. If you experience trouble breathing, this can be serious. If it is severe call 911 IMMEDIATELY. If it is mild, please call our office. If you take any of these medications: Glipizide/Metformin, Avandament, Glucavance, please do not take 48 hours after completing test unless otherwise instructed.  We will call to schedule your test 2-4 weeks out understanding that some insurance companies will need an authorization prior to the service being performed.   For non-scheduling related questions, please contact the cardiac imaging nurse navigator should you have any questions/concerns: Rockwell Alexandria, Cardiac Imaging Nurse Navigator Larey Brick, Cardiac Imaging Nurse Navigator [facility] Direct Office Dial: [phone]   For scheduling needs, including cancellations and rescheduling, please call [place], [phone].    Follow-Up: At [facility], you and your health needs are our priority.  As part of our continuing mission to provide you with exceptional heart care, we have created designated Provider Care Teams.  These Care Teams include your primary Cardiologist (physician) and Advanced Practice Providers (APPs -  Physician Assistants  and Nurse Practitioners) who all work together to provide you with the care you need, when you need it.  Your next appointment:   16 week(s)  Provider:   Thomasene Ripple, DO

## 2023-01-25 ENCOUNTER — Encounter: Payer: Self-pay | Admitting: Cardiology

## 2023-01-25 LAB — BASIC METABOLIC PANEL
Calcium: 9.9 mg/dL (ref 8.7–10.2)
Chloride: 103 mmol/L (ref 96–106)
Creatinine, Ser: 0.78 mg/dL (ref 0.57–1.00)
Glucose: 84 mg/dL (ref 70–99)
Potassium: 4.7 mmol/L (ref 3.5–5.2)
Sodium: 139 mmol/L (ref 134–144)
eGFR: 90 mL/min/{1.73_m2} (ref >59)

## 2023-01-25 LAB — LIPOPROTEIN A (LPA): Lipoprotein (a): <8.4 nmol/L (ref <75.0)

## 2023-01-25 LAB — HEMOGLOBIN A1C: Hgb A1c MFr Bld: 5.7 % — ABNORMAL HIGH (ref 4.8–5.6)

## 2023-01-26 ENCOUNTER — Other Ambulatory Visit (HOSPITAL_COMMUNITY): Payer: Self-pay

## 2023-01-26 ENCOUNTER — Telehealth (HOSPITAL_COMMUNITY): Payer: Self-pay | Admitting: Emergency Medicine

## 2023-01-26 MED ORDER — ROSUVASTATIN CALCIUM 5 MG PO TABS
5.0000 mg | ORAL_TABLET | Freq: Every day | ORAL | 3 refills | Status: DC
  Filled 2023-01-26: qty 90, 90d supply, fill #0
  Filled 2023-05-26: qty 90, 90d supply, fill #1
  Filled 2023-07-19 – 2023-09-04 (×2): qty 90, 90d supply, fill #2
  Filled 2023-12-14: qty 90, 90d supply, fill #3

## 2023-01-26 NOTE — Telephone Encounter (Signed)
Reaching out to patient to offer assistance regarding upcoming cardiac imaging study; pt verbalizes understanding of appt date/time, parking situation and where to check in, pre-test NPO status and medications ordered, and verified current allergies; name and call back number provided for further questions should they arise Shelly Alexandria RN Navigator Cardiac Imaging [facility] [phone] office [phone] cell  Arrival 300 100mg  metoprolol 1:00p  Denies iv issues Aware contrast

## 2023-01-30 ENCOUNTER — Telehealth (HOSPITAL_COMMUNITY): Payer: Self-pay | Admitting: *Deleted

## 2023-01-30 NOTE — Telephone Encounter (Signed)
Attempted to call patient regarding upcoming cardiac CT appointment. °Left message on voicemail with name and callback number ° °Shelly Troop RN Navigator Cardiac Imaging °[facility] °[phone] Office °[phone] Cell ° °

## 2023-01-31 ENCOUNTER — Ambulatory Visit (HOSPITAL_COMMUNITY)
Admission: RE | Admit: 2023-01-31 | Discharge: 2023-01-31 | Disposition: A | Discharge status: Home / Self Care | Payer: Commercial Managed Care - PPO | Source: Ambulatory Visit | Attending: Cardiology | Admitting: Cardiology

## 2023-01-31 DIAGNOSIS — R079 Chest pain, unspecified: Secondary | ICD-10-CM | POA: Insufficient documentation

## 2023-01-31 DIAGNOSIS — R072 Precordial pain: Secondary | ICD-10-CM | POA: Diagnosis not present

## 2023-01-31 DIAGNOSIS — R0789 Other chest pain: Secondary | ICD-10-CM | POA: Insufficient documentation

## 2023-01-31 MED ORDER — NITROGLYCERIN 0.4 MG SL SUBL
0.8000 mg | SUBLINGUAL_TABLET | Freq: Once | SUBLINGUAL | Status: AC
  Administered 2023-01-31: 0.8 mg via SUBLINGUAL

## 2023-01-31 MED ORDER — IOHEXOL 350 MG/ML SOLN
95.0000 mL | Freq: Once | INTRAVENOUS | Status: AC | PRN
  Administered 2023-01-31: 95 mL via INTRAVENOUS

## 2023-01-31 MED ORDER — NITROGLYCERIN 0.4 MG SL SUBL
SUBLINGUAL_TABLET | SUBLINGUAL | Status: AC
  Filled 2023-01-31: qty 2

## 2023-02-28 ENCOUNTER — Other Ambulatory Visit: Payer: Self-pay | Admitting: Family Medicine

## 2023-02-28 ENCOUNTER — Other Ambulatory Visit (HOSPITAL_COMMUNITY): Payer: Self-pay

## 2023-02-28 MED ORDER — SYNTHROID 88 MCG PO TABS
88.0000 ug | ORAL_TABLET | Freq: Every day | ORAL | 0 refills | Status: DC
  Filled 2023-02-28: qty 90, 90d supply, fill #0

## 2023-03-02 ENCOUNTER — Other Ambulatory Visit (HOSPITAL_COMMUNITY): Payer: Self-pay

## 2023-03-17 ENCOUNTER — Other Ambulatory Visit (HOSPITAL_COMMUNITY): Payer: Self-pay

## 2023-03-17 ENCOUNTER — Ambulatory Visit: Payer: Commercial Managed Care - PPO | Admitting: Family Medicine

## 2023-03-17 ENCOUNTER — Encounter: Payer: Self-pay | Admitting: Family Medicine

## 2023-03-17 VITALS — BP 120/76 | HR 78 | Temp 97.9°F | Resp 17 | Ht 66.0 in | Wt 136.0 lb

## 2023-03-17 DIAGNOSIS — J040 Acute laryngitis: Secondary | ICD-10-CM

## 2023-03-17 MED ORDER — SYNTHROID 88 MCG PO TABS
88.0000 ug | ORAL_TABLET | Freq: Every day | ORAL | 0 refills | Status: DC
  Filled 2023-03-17 – 2023-05-26 (×2): qty 90, 90d supply, fill #0

## 2023-03-17 MED ORDER — AZELASTINE HCL 0.1 % NA SOLN
1.0000 | Freq: Two times a day (BID) | NASAL | 5 refills | Status: AC
  Filled 2023-03-17: qty 30, 50d supply, fill #0
  Filled 2023-05-26: qty 30, 50d supply, fill #1

## 2023-03-17 NOTE — Patient Instructions (Signed)
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

## 2023-03-17 NOTE — Progress Notes (Signed)
   Subjective:    Patient ID: Shelly James, female    DOB: Oct 15, 1965, 57 y.o.   MRN: [id number]  HPI Laryngitis- sxs started 10 days ago.  Denies sore throat, no other symptoms.  No recent vocal strain.  Trying vocal rest, tea, water.  Throat remains dry.  'i don't feel sick'.  No increase in reflux or heartburn.  Some PND.  Continues to take Zyrtec daily.   Review of Systems For ROS see HPI     Objective:   Physical Exam Vitals reviewed.  Constitutional:      General: She is not in acute distress.    Appearance: Normal appearance. She is well-developed.  HENT:     Head: Normocephalic and atraumatic.     Right Ear: Tympanic membrane normal.     Left Ear: Tympanic membrane normal.     Nose: Mucosal edema and congestion present. No rhinorrhea.     Right Sinus: No maxillary sinus tenderness or frontal sinus tenderness.     Left Sinus: No maxillary sinus tenderness or frontal sinus tenderness.     Mouth/Throat:     Pharynx: Posterior oropharyngeal erythema (w/ copious PND) present.  Eyes:     Conjunctiva/sclera: Conjunctivae normal.     Pupils: Pupils are equal, round, and reactive to light.  Cardiovascular:     Rate and Rhythm: Normal rate and regular rhythm.     Heart sounds: Normal heart sounds.  Pulmonary:     Effort: Pulmonary effort is normal. No respiratory distress.     Breath sounds: Normal breath sounds. No wheezing or rales.  Musculoskeletal:     Cervical back: Normal range of motion and neck supple.  Lymphadenopathy:     Cervical: No cervical adenopathy.  Skin:    General: Skin is warm and dry.  Neurological:     General: No focal deficit present.     Mental Status: She is alert and oriented to person, place, and time.  Psychiatric:        Mood and Affect: Mood normal.        Behavior: Behavior normal.        Thought Content: Thought content normal.           Assessment & Plan:  Laryngitis- new.  Likely due to copious PND.  Suspect viral/allergy  combo.  Continue Zyrtec.  Add Astelin nasal spray.  Reviewed supportive care and red flags that should prompt return.  Pt expressed understanding and is in agreement w/ plan.

## 2023-04-05 ENCOUNTER — Other Ambulatory Visit (HOSPITAL_COMMUNITY): Payer: Self-pay

## 2023-04-17 ENCOUNTER — Other Ambulatory Visit: Payer: Self-pay | Admitting: Obstetrics and Gynecology

## 2023-04-17 DIAGNOSIS — Z1231 Encounter for screening mammogram for malignant neoplasm of breast: Secondary | ICD-10-CM

## 2023-04-19 ENCOUNTER — Encounter (INDEPENDENT_AMBULATORY_CARE_PROVIDER_SITE_OTHER): Payer: Self-pay

## 2023-05-10 ENCOUNTER — Ambulatory Visit: Admission: RE | Admit: 2023-05-10 | Payer: Commercial Managed Care - PPO | Source: Ambulatory Visit

## 2023-05-10 DIAGNOSIS — Z1231 Encounter for screening mammogram for malignant neoplasm of breast: Secondary | ICD-10-CM

## 2023-05-11 ENCOUNTER — Ambulatory Visit (INDEPENDENT_AMBULATORY_CARE_PROVIDER_SITE_OTHER): Payer: Commercial Managed Care - PPO | Admitting: Family Medicine

## 2023-05-11 ENCOUNTER — Encounter: Payer: Self-pay | Admitting: Family Medicine

## 2023-05-11 VITALS — BP 126/74 | HR 72 | Temp 98.0°F | Resp 18 | Ht 66.0 in | Wt 135.1 lb

## 2023-05-11 DIAGNOSIS — Z0000 Encounter for general adult medical examination without abnormal findings: Secondary | ICD-10-CM

## 2023-05-11 DIAGNOSIS — E785 Hyperlipidemia, unspecified: Secondary | ICD-10-CM | POA: Insufficient documentation

## 2023-05-11 NOTE — Assessment & Plan Note (Signed)
Pt's PE WNL.  Reviewed recent labs- no need to repeat.  UTD on pap, mammo, colonoscopy.  Anticipatory guidance provided.

## 2023-05-11 NOTE — Patient Instructions (Signed)
Follow up in 6 months to recheck cholesterol No need to repeat labs today- yay!!! Keep up the good work- you look great!!! Call with any questions or concerns Stay Safe!  Stay Healthy! Happy Labor Day!!

## 2023-05-11 NOTE — Progress Notes (Signed)
   Subjective:    Patient ID: Shelly James, female    DOB: [birth year], 57 y.o.   MRN: [id number]  HPI CPE- UTD on pap, mammo, colonoscopy  Patient Care Team    Relationship Specialty Notifications Start End  Sheliah Hatch, MD PCP - General Family Medicine  02/15/18   Thomasene Ripple, DO PCP - Cardiology Cardiology  01/25/23   Iva Boop, MD Consulting Physician Gastroenterology  02/15/18   Allie Bossier, MD Consulting Physician Obstetrics and Gynecology  02/15/18      Health Maintenance  Topic Date Due   INFLUENZA VACCINE  04/20/2023   MAMMOGRAM  05/06/2024   PAP SMEAR-Modifier  12/14/2026   Colonoscopy  02/08/2027   DTaP/Tdap/Td (2 - Td or Tdap) 05/19/2029   Hepatitis C Screening  Completed   HIV Screening  Completed   Zoster Vaccines- Shingrix  Completed   HPV VACCINES  Aged Out   COVID-19 Vaccine  Discontinued      Review of Systems Patient reports no vision/ hearing changes, adenopathy,fever, weight change,  persistant/recurrent hoarseness , swallowing issues, chest pain, palpitations, edema, persistant/recurrent cough, hemoptysis, dyspnea (rest/exertional/paroxysmal nocturnal), gastrointestinal bleeding (melena, rectal bleeding), abdominal pain, significant heartburn, bowel changes, GU symptoms (dysuria, hematuria, incontinence), Gyn symptoms (abnormal  bleeding, pain),  syncope, focal weakness, memory loss, numbness & tingling, skin/hair/nail changes, abnormal bruising or bleeding, anxiety, or depression.     Objective:   Physical Exam General Appearance:    Alert, cooperative, no distress, appears stated age  Head:    Normocephalic, without obvious abnormality, atraumatic  Eyes:    PERRL, conjunctiva/corneas clear, EOM's intact both eyes  Ears:    Normal TM's and external ear canals, both ears  Nose:   Nares normal, septum midline, mucosa normal, no drainage    or sinus tenderness  Throat:   Lips, mucosa, and tongue normal; teeth and gums normal  Neck:    Supple, symmetrical, trachea midline, no adenopathy;    Thyroid: no enlargement/tenderness/nodules  Back:     Symmetric, no curvature, ROM normal, no CVA tenderness  Lungs:     Clear to auscultation bilaterally, respirations unlabored  Chest Wall:    No tenderness or deformity   Heart:    Regular rate and rhythm, S1 and S2 normal, no murmur, rub   or gallop  Breast Exam:    Deferred to GYN  Abdomen:     Soft, non-tender, bowel sounds active all four quadrants,    no masses, no organomegaly  Genitalia:    Deferred to GYN  Rectal:    Extremities:   Extremities normal, atraumatic, no cyanosis or edema  Pulses:   2+ and symmetric all extremities  Skin:   Skin color, texture, turgor normal, no rashes or lesions  Lymph nodes:   Cervical, supraclavicular, and axillary nodes normal  Neurologic:   CNII-XII intact, normal strength, sensation and reflexes    throughout          Assessment & Plan:

## 2023-05-15 ENCOUNTER — Ambulatory Visit: Payer: Commercial Managed Care - PPO | Admitting: Cardiology

## 2023-05-26 ENCOUNTER — Other Ambulatory Visit (HOSPITAL_COMMUNITY): Payer: Self-pay

## 2023-06-09 ENCOUNTER — Other Ambulatory Visit (HOSPITAL_COMMUNITY): Payer: Self-pay

## 2023-06-14 ENCOUNTER — Ambulatory Visit
Admission: RE | Admit: 2023-06-14 | Discharge: 2023-06-14 | Disposition: A | Discharge status: Home / Self Care | Payer: Commercial Managed Care - PPO | Source: Ambulatory Visit | Attending: Internal Medicine | Admitting: Internal Medicine

## 2023-06-14 ENCOUNTER — Other Ambulatory Visit (HOSPITAL_COMMUNITY): Payer: Self-pay

## 2023-06-14 VITALS — BP 119/82 | HR 79 | Temp 97.8°F | Resp 16

## 2023-06-14 DIAGNOSIS — R52 Pain, unspecified: Secondary | ICD-10-CM | POA: Diagnosis not present

## 2023-06-14 DIAGNOSIS — Z1152 Encounter for screening for COVID-19: Secondary | ICD-10-CM | POA: Diagnosis not present

## 2023-06-14 DIAGNOSIS — B349 Viral infection, unspecified: Secondary | ICD-10-CM | POA: Insufficient documentation

## 2023-06-14 DIAGNOSIS — R0981 Nasal congestion: Secondary | ICD-10-CM | POA: Insufficient documentation

## 2023-06-14 LAB — POCT INFLUENZA A/B
Influenza A, POC: NEGATIVE
Influenza B, POC: NEGATIVE

## 2023-06-14 MED ORDER — BENZONATATE 200 MG PO CAPS
200.0000 mg | ORAL_CAPSULE | Freq: Three times a day (TID) | ORAL | 0 refills | Status: AC | PRN
Start: 2023-06-14 — End: ?
  Filled 2023-06-14: qty 20, 7d supply, fill #0

## 2023-06-14 NOTE — Discharge Instructions (Addendum)
The clinic will contact you with results of your COVID test done today if positive.  Lots of rest and fluids.  Continue your nasal sprays and/or nasal rinses as needed.  You may take Tessalon as needed for cough.  Follow-up with your PCP if your symptoms or not improving.  Please go to the ER if you develop any worsening symptoms.  I hope you feel better soon!

## 2023-06-14 NOTE — ED Provider Notes (Signed)
[facility]    CSN: [id number] Arrival date & time: 06/14/23  0808      History   Chief Complaint Chief Complaint  Patient presents with   Nasal Congestion    Possible cold or flu, would like to have a COVID/FLU test - Entered by patient    HPI Shelly James is a 57 y.o. female  presents for evaluation of URI symptoms for 2 days. Patient reports associated symptoms of sinus congestion/pain/body aches, fatigue. Denies N/V/D, fevers, sore throat, cough, ear pain, shortness of breath. Patient does not have a hx of asthma. Patient does not have a history of smoking.  Reports no sick contacts.  Pt has taken Sudafed and Tylenol OTC for symptoms. Pt has no other concerns at this time.   HPI  Past Medical History:  Diagnosis Date   Allergy    Asthma    Graves disease    Hypothyroidism, postablative    Urticaria    Vaginal Pap smear, abnormal     Patient Active Problem List   Diagnosis Date Noted   Hyperlipidemia 05/11/2023   Trochanteric bursitis of right hip 03/01/2021   Hot flashes 03/01/2021   Chronic maxillary sinusitis 08/12/2020   Vitamin D deficiency 11/11/2019   IBS (irritable bowel syndrome) 10/02/2019   Physical exam 02/15/2018   Chronic idiopathic urticaria 12/27/2016   Hypothyroidism, postablative    ALLERGIC RHINITIS 07/05/2010   GERD 07/05/2010    Past Surgical History:  Procedure Laterality Date   COLONOSCOPY     ESOPHAGOGASTRODUODENOSCOPY     LEEP     TONSILLECTOMY AND ADENOIDECTOMY     wisdom      OB History     Gravida  2   Para  2   Term  2   Preterm      AB      Living  2      SAB      IAB      Ectopic      Multiple      Live Births               Home Medications    Prior to Admission medications   Medication Sig Start Date End Date Taking? Authorizing Provider  benzonatate (TESSALON) 200 MG capsule Take 1 capsule (200 mg total) by mouth 3 (three) times daily as needed. 06/14/23  Yes Radford Pax, NP  azelastine (ASTELIN) 0.1 % nasal spray Place 1 spray into both nostrils 2 (two) times daily. Use in each nostril as directed 03/17/23   Sheliah Hatch, MD  cetirizine (ZYRTEC) 10 MG tablet Take 10 mg by mouth daily.    [provider]  metoprolol tartrate (LOPRESSOR) 100 MG tablet Take 2 hours prior to CT 01/23/23   Tobb, Kardie, DO  omeprazole (PRILOSEC) 40 MG capsule Take 1 capsule (40 mg total) by mouth daily. 01/19/23   Sheliah Hatch, MD  rosuvastatin (CRESTOR) 5 MG tablet Take 1 tablet (5 mg total) by mouth daily. 01/26/23   Tobb, Kardie, DO  SYNTHROID 88 MCG tablet Take 1 tablet (88 mcg total) by mouth daily. 03/17/23   Sheliah Hatch, MD    Family History Family History  Problem Relation Age of Onset   Celiac disease Sister    Raynaud syndrome Sister    Cancer Father        mesothelioma   Thyroid disease Maternal Grandmother    Allergic rhinitis Neg Hx    Angioedema  Neg Hx    Asthma Neg Hx    Immunodeficiency Neg Hx    Urticaria Neg Hx    Eczema Neg Hx    Colon cancer Neg Hx    Stomach cancer Neg Hx     Social History Social History   Tobacco Use   Smoking status: Never   Smokeless tobacco: Never  Vaping Use   Vaping status: Never Used  Substance Use Topics   Alcohol use: Yes    Comment: occ   Drug use: No     Allergies   Hydrocodone   Review of Systems Review of Systems  Constitutional:  Positive for fatigue.  HENT:  Positive for congestion and sinus pain.   Musculoskeletal:  Positive for myalgias.     Physical Exam Triage Vital Signs ED Triage Vitals  Encounter Vitals Group     BP 06/14/23 0827 119/82     Systolic BP Percentile --      Diastolic BP Percentile --      Pulse Rate 06/14/23 0827 79     Resp 06/14/23 0827 16     Temp 06/14/23 0827 97.8 F (36.6 C)     Temp Source 06/14/23 0827 Oral     SpO2 06/14/23 0827 98 %     Weight --      Height --      Head Circumference --      Peak Flow --      Pain Score  06/14/23 0826 0     Pain Loc --      Pain Education --      Exclude from Growth Chart --    No data found.  Updated Vital Signs BP 119/82 (BP Location: Right Arm)   Pulse 79   Temp 97.8 F (36.6 C) (Oral)   Resp 16   LMP 10/18/2017   SpO2 98%   Visual Acuity Right Eye Distance:   Left Eye Distance:   Bilateral Distance:    Right Eye Near:   Left Eye Near:    Bilateral Near:     Physical Exam Vitals and nursing note reviewed.  Constitutional:      General: She is not in acute distress.    Appearance: She is well-developed. She is not ill-appearing.  HENT:     Head: Normocephalic and atraumatic.     Right Ear: Tympanic membrane and ear canal normal.     Left Ear: Tympanic membrane and ear canal normal.     Nose: Congestion present.     Mouth/Throat:     Mouth: Mucous membranes are moist.     Pharynx: Oropharynx is clear. Uvula midline. No oropharyngeal exudate or posterior oropharyngeal erythema.     Tonsils: No tonsillar exudate or tonsillar abscesses.  Eyes:     Conjunctiva/sclera: Conjunctivae normal.     Pupils: Pupils are equal, round, and reactive to light.  Cardiovascular:     Rate and Rhythm: Normal rate and regular rhythm.     Heart sounds: Normal heart sounds.  Pulmonary:     Effort: Pulmonary effort is normal.     Breath sounds: Normal breath sounds.  Musculoskeletal:     Cervical back: Normal range of motion and neck supple.  Lymphadenopathy:     Cervical: No cervical adenopathy.  Skin:    General: Skin is warm and dry.  Neurological:     General: No focal deficit present.     Mental Status: She is alert and oriented to person, place, and time.  Psychiatric:        Mood and Affect: Mood normal.        Behavior: Behavior normal.      UC Treatments / Results  Labs (all labs ordered are listed, but only abnormal results are displayed) Labs Reviewed  SARS CORONAVIRUS 2 (TAT 6-24 HRS)  POCT INFLUENZA A/B    EKG   Radiology No results  found.  Procedures Procedures (including critical care time)  Medications Ordered in UC Medications - No data to display  Initial Impression / Assessment and Plan / UC Course  I have reviewed the triage vital signs and the nursing notes.  Pertinent labs & imaging results that were available during my care of the patient were reviewed by me and considered in my medical decision making (see chart for details).     Reviewed exam and symptoms with patient.  No red flags.  Negative rapid flu.  COVID PCR and will contact if positive.  Discussed viral illness and symptomatic treatment.  Tessalon as needed for cough.  PCP follow-up if symptoms do not improve..  ER precautions reviewed and patient verbalized understanding. Final Clinical Impressions(s) / UC Diagnoses   Final diagnoses:  Sinus congestion  Body aches  Viral illness     Discharge Instructions      The clinic will contact you with results of your COVID test done today if positive.  Lots of rest and fluids.  Continue your nasal sprays and/or nasal rinses as needed.  Follow-up with your PCP if your symptoms or not improving.  Please go to the ER if you develop any worsening symptoms.  I hope you feel better soon!     ED Prescriptions     Medication Sig Dispense Auth. Provider   benzonatate (TESSALON) 200 MG capsule Take 1 capsule (200 mg total) by mouth 3 (three) times daily as needed. 20 capsule Radford Pax, NP      PDMP not reviewed this encounter.   Radford Pax, NP 06/14/23 0900

## 2023-06-14 NOTE — ED Triage Notes (Signed)
Pt presents to UC w/ c/o nasal congestion, body aches, fatigue, sinus pain and pressure starting x2 days. Tylenol and sudafed gave some relief.

## 2023-06-15 LAB — SARS CORONAVIRUS 2 (TAT 6-24 HRS): SARS Coronavirus 2: NEGATIVE

## 2023-06-16 ENCOUNTER — Other Ambulatory Visit (HOSPITAL_COMMUNITY): Payer: Self-pay

## 2023-06-19 DIAGNOSIS — Z79899 Other long term (current) drug therapy: Secondary | ICD-10-CM | POA: Diagnosis not present

## 2023-06-19 LAB — LIPID PANEL
Chol/HDL Ratio: 2.7 {ratio} (ref 0.0–4.4)
Cholesterol, Total: 187 mg/dL (ref 100–199)
HDL: 70 mg/dL (ref >39)
LDL Chol Calc (NIH): 100 mg/dL — ABNORMAL HIGH (ref 0–99)
Triglycerides: 94 mg/dL (ref 0–149)
VLDL Cholesterol Cal: 17 mg/dL (ref 5–40)

## 2023-06-20 ENCOUNTER — Ambulatory Visit: Payer: Commercial Managed Care - PPO | Admitting: Cardiology

## 2023-06-21 ENCOUNTER — Ambulatory Visit: Payer: Commercial Managed Care - PPO | Attending: Cardiology | Admitting: Cardiology

## 2023-06-21 ENCOUNTER — Encounter: Payer: Self-pay | Admitting: Cardiology

## 2023-06-21 VITALS — BP 104/76 | HR 68 | Ht 67.0 in | Wt 136.6 lb

## 2023-06-21 DIAGNOSIS — E782 Mixed hyperlipidemia: Secondary | ICD-10-CM | POA: Diagnosis not present

## 2023-06-21 DIAGNOSIS — R7303 Prediabetes: Secondary | ICD-10-CM

## 2023-06-21 NOTE — Patient Instructions (Signed)
Medication Instructions:  Your physician recommends that you continue on your current medications as directed. Please refer to the Current Medication list given to you today.  *If you need a refill on your cardiac medications before your next appointment, please call your pharmacy*   Lab Work: None   Testing/Procedures: None   Follow-Up: At [facility], you and your health needs are our priority.  As part of our continuing mission to provide you with exceptional heart care, we have created designated Provider Care Teams.  These Care Teams include your primary Cardiologist (physician) and Advanced Practice Providers (APPs -  Physician Assistants and Nurse Practitioners) who all work together to provide you with the care you need, when you need it.   Your next appointment:   2 year(s)  Provider:   Thomasene Ripple, DO

## 2023-06-21 NOTE — Progress Notes (Signed)
Cardiology Office Note:    Date:  06/21/2023   ID:  Shelly James, DOB 25-Feb-1966, MRN [id number]  PCP:  Sheliah Hatch, MD  Cardiologist:  Thomasene Ripple, DO  Electrophysiologist:  None   Referring MD: Sheliah Hatch, MD   " I am doing well"  History of Present Illness:    Shelly James is a 57 y.o. female with a hx of Prediabetes, hypothyroidism, hypercholesteremia, hypertriglyceridemia, vitamin D deficiency and GERD here today for follow-up visit.  At her last visit she was experiencing chest pain a send the patient for coronary CTA which came back with no evidence of coronary artery disease.  During that time we started the patient on Crestor 5 mg daily.  She reports regular exercise, including walking five days a week, working out twice a week, and hiking on weekends. She also mentions a significant reduction in her triglyceride levels and a slight improvement in her LDL levels. The patient expresses some concern about her prediabetes status but notes that she has managed to control it in the past. She is committed to maintaining a healthy diet and continuing her exercise regimen to manage her hyperlipidemia and prediabetes.   Past Medical History:  Diagnosis Date   Allergy    Asthma    Graves disease    Hypothyroidism, postablative    Urticaria    Vaginal Pap smear, abnormal     Past Surgical History:  Procedure Laterality Date   COLONOSCOPY     ESOPHAGOGASTRODUODENOSCOPY     LEEP     TONSILLECTOMY AND ADENOIDECTOMY     wisdom      Current Medications: Current Meds  Medication Sig   azelastine (ASTELIN) 0.1 % nasal spray Place 1 spray into both nostrils 2 (two) times daily. Use in each nostril as directed   benzonatate (TESSALON) 200 MG capsule Take 1 capsule (200 mg total) by mouth 3 (three) times daily as needed.   cetirizine (ZYRTEC) 10 MG tablet Take 10 mg by mouth daily.   metoprolol tartrate (LOPRESSOR) 100 MG tablet Take 2 hours prior to  CT   omeprazole (PRILOSEC) 40 MG capsule Take 1 capsule (40 mg total) by mouth daily.   rosuvastatin (CRESTOR) 5 MG tablet Take 1 tablet (5 mg total) by mouth daily.   SYNTHROID 88 MCG tablet Take 1 tablet (88 mcg total) by mouth daily.     Allergies:   Hydrocodone   Social History   Socioeconomic History   Marital status: Married    Spouse name: Not on file   Number of children: 2   Years of education: Not on file   Highest education level: Not on file  Occupational History   Occupation: Conservator, museum/gallery    Employer: [employer]  Tobacco Use   Smoking status: Never   Smokeless tobacco: Never  Vaping Use   Vaping status: Never Used  Substance and Sexual Activity   Alcohol use: Yes    Comment: occ   Drug use: No   Sexual activity: Yes    Partners: Male    Birth control/protection: Post-menopausal  Other Topics Concern   Not on file  Social History Narrative   Married, 2 sons   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 There is no history of Allergic rhinitis, Angioedema, Asthma, Immunodeficiency, Urticaria, Eczema, Colon cancer, or Stomach cancer.  ROS:   Review of Systems  Constitution: Negative for decreased appetite, fever and weight gain.  HENT: Negative for congestion, ear discharge, hoarse voice and sore throat.   Eyes: Negative for discharge, redness, vision loss in right eye and visual halos.  Cardiovascular: Negative for chest pain, dyspnea on  exertion, leg swelling, orthopnea and palpitations.  Respiratory: Negative for cough, hemoptysis, shortness of breath and snoring.   Endocrine: Negative for heat intolerance and polyphagia.  Hematologic/Lymphatic: Negative for bleeding problem. Does not bruise/bleed easily.  Skin: Negative for flushing, nail changes, rash and suspicious lesions.  Musculoskeletal: Negative for arthritis, joint pain, muscle cramps, myalgias, neck pain and stiffness.  Gastrointestinal: Negative for abdominal pain, bowel incontinence, diarrhea and excessive appetite.  Genitourinary: Negative for decreased libido, genital sores and incomplete emptying.  Neurological: Negative for brief paralysis, focal weakness, headaches and loss of balance.  Psychiatric/Behavioral: Negative for altered mental status, depression and suicidal ideas.  Allergic/Immunologic: Negative for HIV exposure and persistent infections.    EKGs/Labs/Other Studies Reviewed:    The following studies were reviewed today:   EKG:  The ekg ordered today demonstrates   Recent Labs: 01/19/2023: ALT 17; Hemoglobin 14.8; Platelets 328.0; TSH 2.58 01/23/2023: BUN 17; Creatinine, Ser 0.78; Magnesium 2.2; Potassium 4.7; Sodium 139  Recent Lipid Panel    Component Value Date/Time   CHOL 187 06/19/2023 0848   TRIG 94 06/19/2023 0848   HDL 70 06/19/2023 0848   CHOLHDL 2.7 06/19/2023 0848   CHOLHDL 3 01/19/2023 1320   VLDL 32.8 01/19/2023 1320   LDLCALC 100 (H) 06/19/2023 0848   LDLCALC 80 12/13/2021 0945    Physical Exam:    VS:  BP 104/76 (BP Location: Left Arm, Patient Position: Sitting, Cuff Size: Normal)   Pulse 68   Ht 5\' 7"  (1.702 m)   Wt 136 lb 9.6 oz (62 kg)   LMP 10/18/2017   SpO2 98%   BMI 21.39 kg/m     Wt Readings from Last 3 Encounters:  06/21/23 136 lb 9.6 oz (62 kg)  05/11/23 135 lb 2 oz (61.3 kg)  03/17/23 136 lb (61.7 kg)     GEN: Well nourished, well developed in no acute distress HEENT: Normal NECK: No JVD; No  carotid bruits LYMPHATICS: No lymphadenopathy CARDIAC: S1S2 noted,RRR, no murmurs, rubs, gallops RESPIRATORY:  Clear to auscultation without rales, wheezing or rhonchi  ABDOMEN: Soft, non-tender, non-distended, +bowel sounds, no guarding. EXTREMITIES: No edema, No cyanosis, no clubbing MUSCULOSKELETAL:  No deformity  SKIN: Warm and dry NEUROLOGIC:  Alert and oriented x 3, non-focal PSYCHIATRIC:  Normal affect, good insight  ASSESSMENT:    1. Mixed hyperlipidemia   2. Prediabetes    PLAN:    Hyperlipidemia -Continue current regimen of Crestor. -Recheck lipid panel in 1 year.  Prediabetes Discussed the importance of diet and exercise in managing prediabetes. -Continue daily exercise and carb-conscious diet.   Follow-up in 2 years or sooner if needed.    Medication Adjustments/Labs and Tests Ordered: Current medicines are reviewed at length with the patient today.  Concerns regarding medicines are outlined above.  No orders of the defined types were  placed in this encounter.  No orders of the defined types were placed in this encounter.   There are no Patient Instructions on file for this visit.   Adopting a Healthy Lifestyle.  Know what a healthy weight is for you (roughly BMI <25) and aim to maintain this   Aim for 7+ servings of fruits and vegetables daily   65-80+ fluid ounces of water or unsweet tea for healthy kidneys   Limit to max 1 drink of alcohol per day; avoid smoking/tobacco   Limit animal fats in diet for cholesterol and heart health - choose grass fed whenever available   Avoid highly processed foods, and foods high in saturated/trans fats   Aim for low stress - take time to unwind and care for your mental health   Aim for 150 min of moderate intensity exercise weekly for heart health, and weights twice weekly for bone health   Aim for 7-9 hours of sleep daily   When it comes to diets, agreement about the perfect plan isnt easy to find, even  among the experts. Experts at the [facility] [employer] developed an idea known as the Healthy Eating Plate. Just imagine a plate divided into logical, healthy portions.   The emphasis is on diet quality:   Load up on vegetables and fruits - one-half of your plate: Aim for color and variety, and remember that potatoes dont count.   Go for whole grains - one-quarter of your plate: Whole wheat, barley, wheat berries, quinoa, oats, brown rice, and foods made with them. If you want pasta, go with whole wheat pasta.   Protein power - one-quarter of your plate: Fish, chicken, beans, and nuts are all healthy, versatile protein sources. Limit red meat.   The diet, however, does go beyond the plate, offering a few other suggestions.   Use healthy plant oils, such as olive, canola, soy, corn, sunflower and peanut. Check the labels, and avoid partially hydrogenated oil, which have unhealthy trans fats.   If youre thirsty, drink water. Coffee and tea are good in moderation, but skip sugary drinks and limit milk and dairy products to one or two daily servings.   The type of carbohydrate in the diet is more important than the amount. Some sources of carbohydrates, such as vegetables, fruits, whole grains, and beans-are healthier than others.   Finally, stay active  Signed, Thomasene Ripple, DO  06/21/2023 1:32 PM    [facility]

## 2023-07-09 ENCOUNTER — Ambulatory Visit
Admission: RE | Admit: 2023-07-09 | Discharge: 2023-07-09 | Disposition: A | Discharge status: Home / Self Care | Payer: Commercial Managed Care - PPO | Source: Ambulatory Visit | Attending: Family Medicine | Admitting: Family Medicine

## 2023-07-09 VITALS — BP 120/84 | HR 88 | Temp 97.8°F | Resp 17

## 2023-07-09 DIAGNOSIS — K0889 Other specified disorders of teeth and supporting structures: Secondary | ICD-10-CM | POA: Diagnosis not present

## 2023-07-09 MED ORDER — TRAMADOL HCL 50 MG PO TABS: 50.0000 mg | ORAL_TABLET | Freq: Four times a day (QID) | ORAL | 0 refills | Status: AC | PRN

## 2023-07-09 MED ORDER — AMOXICILLIN-POT CLAVULANATE 875-125 MG PO TABS: 1.0000 | ORAL_TABLET | Freq: Two times a day (BID) | ORAL | 0 refills | Status: DC

## 2023-07-09 NOTE — Discharge Instructions (Signed)
You were prescribed an antibiotic. Please take this exactly as directed and do not stop taking it until the entire course of medicine is finished, even if you begin to feel better before finishing the course. We have also given you pain medication; please take this only as prescribed for severe pain, and do not drink or drive while taking this medication. Follow up with your primary care provider as needed.  Schedule an appointment with your dentist or call local dentists to see if they take your insurance or can do a payment payment plan.

## 2023-07-09 NOTE — ED Provider Notes (Signed)
[facility]    CSN: [id number] Arrival date & time: 07/09/23  1058      History   Chief Complaint Chief Complaint  Patient presents with   Dental Problem    Could be congestion - Entered by patient    HPI Shelly James is a 57 y.o. female.   HPI  Shelly James presents for right upper dental pain that started Thursday.  No rhinorrhea or nasal congestion.   Previously had a crown put on the tooth that is hurting.  She is starting to have pain around her eye.  Has been taking Tylenol and ibuprofen for pain but this is not helping.  She was not able to sleep last night due to pain.   Shelly James has otherwise been well and has no other concerns.    Past Medical History:  Diagnosis Date   Allergy    Asthma    Graves disease    Hypothyroidism, postablative    Urticaria    Vaginal Pap smear, abnormal     Patient Active Problem List   Diagnosis Date Noted   Hyperlipidemia 05/11/2023   Trochanteric bursitis of right hip 03/01/2021   Hot flashes 03/01/2021   Chronic maxillary sinusitis 08/12/2020   Vitamin D deficiency 11/11/2019   IBS (irritable bowel syndrome) 10/02/2019   Physical exam 02/15/2018   Chronic idiopathic urticaria 12/27/2016   Hypothyroidism, postablative    ALLERGIC RHINITIS 07/05/2010   GERD 07/05/2010    Past Surgical History:  Procedure Laterality Date   COLONOSCOPY     ESOPHAGOGASTRODUODENOSCOPY     LEEP     TONSILLECTOMY AND ADENOIDECTOMY     wisdom      OB History     Gravida  2   Para  2   Term  2   Preterm      AB      Living  2      SAB      IAB      Ectopic      Multiple      Live Births               Home Medications    Prior to Admission medications   Medication Sig Start Date End Date Taking? Authorizing Provider  amoxicillin-clavulanate (AUGMENTIN) 875-125 MG tablet Take 1 tablet by mouth every 12 (twelve) hours. 07/09/23  Yes Noemy Hallmon, DO  cetirizine (ZYRTEC) 10 MG tablet Take  10 mg by mouth daily.   Yes [provider]  omeprazole (PRILOSEC) 40 MG capsule Take 1 capsule (40 mg total) by mouth daily. 01/19/23  Yes Sheliah Hatch, MD  rosuvastatin (CRESTOR) 5 MG tablet Take 1 tablet (5 mg total) by mouth daily. 01/26/23  Yes Tobb, Kardie, DO  SYNTHROID 88 MCG tablet Take 1 tablet (88 mcg total) by mouth daily. 03/17/23  Yes Sheliah Hatch, MD  traMADol (ULTRAM) 50 MG tablet Take 1 tablet (50 mg total) by mouth every 6 (six) hours as needed. 07/09/23  Yes Tiasha Helvie, DO  azelastine (ASTELIN) 0.1 % nasal spray Place 1 spray into both nostrils 2 (two) times daily. Use in each nostril as directed 03/17/23   Sheliah Hatch, MD  benzonatate (TESSALON) 200 MG capsule Take 1 capsule (200 mg total) by mouth 3 (three) times daily as needed. 06/14/23   Radford Pax, NP  metoprolol tartrate (LOPRESSOR) 100 MG tablet Take 2 hours prior to CT 01/23/23   Tobb, Lavona Mound, DO    Family  History Family History  Problem Relation Age of Onset   Celiac disease Sister    Raynaud syndrome Sister    Cancer Father        mesothelioma   Thyroid disease Maternal Grandmother    Allergic rhinitis Neg Hx    Angioedema Neg Hx    Asthma Neg Hx    Immunodeficiency Neg Hx    Urticaria Neg Hx    Eczema Neg Hx    Colon cancer Neg Hx    Stomach cancer Neg Hx     Social History Social History   Tobacco Use   Smoking status: Never   Smokeless tobacco: Never  Vaping Use   Vaping status: Never Used  Substance Use Topics   Alcohol use: Yes    Comment: occ   Drug use: No     Allergies   Hydrocodone   Review of Systems Review of Systems : negative unless otherwise stated in HPI.      Physical Exam Triage Vital Signs ED Triage Vitals  Encounter Vitals Group     BP      Systolic BP Percentile      Diastolic BP Percentile      Pulse      Resp      Temp      Temp src      SpO2      Weight      Height      Head Circumference      Peak Flow      Pain  Score      Pain Loc      Pain Education      Exclude from Growth Chart    No data found.  Updated Vital Signs BP 120/84 (BP Location: Right Arm)   Pulse 88   Temp 97.8 F (36.6 C) (Oral)   Resp 17   LMP 10/18/2017   SpO2 98%   Visual Acuity Right Eye Distance:   Left Eye Distance:   Bilateral Distance:    Right Eye Near:   Left Eye Near:    Bilateral Near:     Physical Exam  GEN:     alert, uncomfortable appearing female in no distress    HENT:  mucus membranes moist, oropharyngeal without lesions exudates or erythema, nasal discharge, posterior right 2nd molar tender to percussion, no trismus, no secretion pooling, no palpable induration and no visible swelling of the floor the mouth,  normal jaw movement without difficulty EYES:   no scleral injection or discharge NECK:  normal ROM RESP:  no increased work of breathing CVS:   regular rate  Skin:   warm and dry, no rash or skin changes of on external jaw    UC Treatments / Results  Labs (all labs ordered are listed, but only abnormal results are displayed) Labs Reviewed - No data to display  EKG   Radiology No results found.  Procedures Procedures (including critical care time)  Medications Ordered in UC Medications - No data to display  Initial Impression / Assessment and Plan / UC Course  I have reviewed the triage vital signs and the nursing notes.  Pertinent labs & imaging results that were available during my care of the patient were reviewed by me and considered in my medical decision making (see chart for details).     Pt is a 57 y.o. female who presents for 4 days of dental pain. [name] is afebrile here without recent antipyretics. Satting well on room  air. Overall pt is uncomfortable appearing, well hydrated, without respiratory distress.  Dental exam concerning for developing dental abscess. Augmentin prescribed.  - continue Tylenol with Motrin as needed for discomfort - Tramadol prescribed  for severe pain - Gargle with salt water several times a day - Follow up with her dentist    Discussed MDM, treatment plan and plan for follow-up with patient who agrees with plan.   Final Clinical Impressions(s) / UC Diagnoses   Final diagnoses:  Pain, dental     Discharge Instructions      You were prescribed an antibiotic. Please take this exactly as directed and do not stop taking it until the entire course of medicine is finished, even if you begin to feel better before finishing the course. We have also given you pain medication; please take this only as prescribed for severe pain, and do not drink or drive while taking this medication. Follow up with your primary care provider as needed.  Schedule an appointment with your dentist or call local dentists to see if they take your insurance or can do a payment payment plan.          ED Prescriptions     Medication Sig Dispense Auth. Provider   amoxicillin-clavulanate (AUGMENTIN) 875-125 MG tablet Take 1 tablet by mouth every 12 (twelve) hours. 14 tablet Zaydah Nawabi, DO   traMADol (ULTRAM) 50 MG tablet Take 1 tablet (50 mg total) by mouth every 6 (six) hours as needed. 8 tablet Charlies Rayburn, Seward Meth, DO      I have reviewed the PDMP during this encounter.   Katha Cabal, DO 07/09/23 1125

## 2023-07-09 NOTE — ED Triage Notes (Signed)
Patient states that she is having dental issues x 4 days or a sinus infection. Top right dental pain and eye pain.

## 2023-07-19 ENCOUNTER — Other Ambulatory Visit: Payer: Self-pay | Admitting: Family Medicine

## 2023-07-19 ENCOUNTER — Other Ambulatory Visit (HOSPITAL_COMMUNITY): Payer: Self-pay

## 2023-07-19 ENCOUNTER — Other Ambulatory Visit: Payer: Self-pay

## 2023-07-19 MED ORDER — SYNTHROID 88 MCG PO TABS
88.0000 ug | ORAL_TABLET | Freq: Every day | ORAL | 0 refills | Status: DC
  Filled 2023-07-19 – 2023-08-20 (×2): qty 90, 90d supply, fill #0

## 2023-07-20 ENCOUNTER — Other Ambulatory Visit (HOSPITAL_COMMUNITY): Payer: Self-pay

## 2023-07-24 ENCOUNTER — Other Ambulatory Visit (HOSPITAL_COMMUNITY): Payer: Self-pay

## 2023-07-28 ENCOUNTER — Encounter: Payer: Commercial Managed Care - PPO | Admitting: Family Medicine

## 2023-08-21 ENCOUNTER — Other Ambulatory Visit (HOSPITAL_COMMUNITY): Payer: Self-pay

## 2023-08-22 ENCOUNTER — Other Ambulatory Visit (HOSPITAL_COMMUNITY): Payer: Self-pay

## 2023-09-01 ENCOUNTER — Ambulatory Visit: Payer: Commercial Managed Care - PPO | Admitting: Podiatry

## 2023-09-01 ENCOUNTER — Encounter: Payer: Self-pay | Admitting: Podiatry

## 2023-09-01 ENCOUNTER — Ambulatory Visit (INDEPENDENT_AMBULATORY_CARE_PROVIDER_SITE_OTHER): Payer: Commercial Managed Care - PPO

## 2023-09-01 DIAGNOSIS — M21619 Bunion of unspecified foot: Secondary | ICD-10-CM

## 2023-09-01 DIAGNOSIS — M778 Other enthesopathies, not elsewhere classified: Secondary | ICD-10-CM

## 2023-09-01 NOTE — Patient Instructions (Signed)
For inserts I like powersteps, superfeet, aetrex   --    Bunion A bunion (hallux valgus) is a bump that forms slowly on the inner side of the big toe joint. It occurs when the big toe turns toward the second toe. Bunions may be small at first, but they often get larger over time. They can make walking painful. What are the causes? This condition may be caused by: Wearing narrow or pointed shoes that force the big toe to press against the other toes. Abnormal foot development that causes the foot to roll inward. Changes in the foot that are caused by certain diseases, such as rheumatoid arthritis or polio. A foot injury. What increases the risk? The following factors may make you more likely to develop this condition: Wearing shoes that squeeze the toes together. Having certain diseases, such as: Rheumatoid arthritis. Polio. Cerebral palsy. Having family members who have bunions. Being born with abnormally shaped feet (a foot deformity), such as flat feet or low arches. Doing activities that put a lot of pressure on the feet, such as ballet dancing. What are the signs or symptoms?  The main symptom of this condition is a bump on your big toe that you can notice. Other symptoms may include: Pain. Redness and inflammation around your big toe. Thick or hardened skin on your big toe or between your toes. Stiffness or loss of motion in your big toe. Trouble with walking. How is this diagnosed? This condition may be diagnosed based on your symptoms, medical history, and activities. You may also have tests and imaging, such as: X-rays. These allow your health care provider to check the position of the bones in your foot and look for damage to your joint. They also help your health care provider determine the severity of your bunion and the best way to treat it. Joint aspiration. In this test, a sample of fluid is removed from the toe joint. This test may be done if you are in a lot of  pain. It helps rule out diseases that cause painful swelling of the joints, such as arthritis or gout. How is this treated? Treatment depends on the severity of your symptoms. The goal of treatment is to relieve symptoms and prevent your bunion from getting worse. Your health care provider may recommend: Wearing shoes that have a wide toe box, or using bunion pads to cushion the affected area. Taping your toes together to keep them in a normal position. Placing a device inside your shoe (orthotic device) to help reduce pressure on your toe joint. Taking medicine to ease pain and inflammation. Putting ice or heat on the affected area. Doing stretching exercises. Surgery, for severe cases. Follow these instructions at home: Managing pain, stiffness, and swelling     If directed, put ice on the painful area. To do this: Put ice in a plastic bag. Place a towel between your skin and the bag. Leave the ice on for 20 minutes, 2-3 times a day. Remove the ice if your skin turns bright red. This is very important. If you cannot feel pain, heat, or cold, you have a greater risk of damage to the area. If directed, apply heat to the affected area before you exercise. Use the heat source that your health care provider recommends, such as a moist heat pack or a heating pad. Place a towel between your skin and the heat source. Leave the heat on for 20-30 minutes. Remove the heat if your skin turns bright  red. This is especially important if you are unable to feel pain, heat, or cold. You have a greater risk of getting burned. General instructions Do exercises as told by your health care provider. Support your toe joint with proper footwear, shoe padding, or taping as told by your health care provider. Take over-the-counter and prescription medicines only as told by your health care provider. Do not use any products that contain nicotine or tobacco, such as cigarettes, e-cigarettes, and chewing tobacco.  If you need help quitting, ask your health care provider. Keep all follow-up visits. This is important. Contact a health care provider if: Your symptoms get worse. Your symptoms do not improve in 2 weeks. Get help right away if: You have severe pain and trouble with walking. Summary A bunion is a bump on the inner side of the big toe joint that forms when the big toe turns toward the second toe. Bunions can make walking painful. Treatment depends on the severity of your symptoms. Support your toe joint with proper footwear, shoe padding, or taping as told by your health care provider. This information is not intended to replace advice given to you by your health care provider. Make sure you discuss any questions you have with your health care provider. Document Revised: 01/09/2020 Document Reviewed: 01/10/2020 Elsevier Patient Education  2024 [employer].

## 2023-09-04 ENCOUNTER — Other Ambulatory Visit (HOSPITAL_COMMUNITY): Payer: Self-pay

## 2023-09-04 ENCOUNTER — Other Ambulatory Visit: Payer: Self-pay | Admitting: Family Medicine

## 2023-09-04 MED ORDER — OMEPRAZOLE 40 MG PO CPDR
40.0000 mg | DELAYED_RELEASE_CAPSULE | Freq: Every day | ORAL | 3 refills | Status: DC
  Filled 2023-09-04: qty 30, 30d supply, fill #0
  Filled 2023-11-21: qty 30, 30d supply, fill #1

## 2023-09-04 NOTE — Progress Notes (Unsigned)
Subjective:   Patient ID: Shelly James, female   DOB: 57 y.o.   MRN: [id number]   HPI Chief Complaint  Patient presents with   Bunions    RM#12 Right foot pain causing discomfort and swelling comes and goes.Family history of bunions.   57 year old female presents the office today with above concerns.  She was having bunions looked at to see if she is to do something now or not.  She is an avid hiker, walker.  She also feels that her flexibility is not as great to her ankles as what it had been previously.  Does not recall any injuries.  No recent treatment.   Review of Systems  All other systems reviewed and are negative.  Past Medical History:  Diagnosis Date   Allergy    Asthma    Graves disease    Hypothyroidism, postablative    Urticaria    Vaginal Pap smear, abnormal     Past Surgical History:  Procedure Laterality Date   COLONOSCOPY     ESOPHAGOGASTRODUODENOSCOPY     LEEP     TONSILLECTOMY AND ADENOIDECTOMY     wisdom       Current Outpatient Medications:    amoxicillin-clavulanate (AUGMENTIN) 875-125 MG tablet, Take 1 tablet by mouth every 12 (twelve) hours., Disp: 14 tablet, Rfl: 0   azelastine (ASTELIN) 0.1 % nasal spray, Place 1 spray into both nostrils 2 (two) times daily. Use in each nostril as directed, Disp: 30 mL, Rfl: 5   benzonatate (TESSALON) 200 MG capsule, Take 1 capsule (200 mg total) by mouth 3 (three) times daily as needed., Disp: 20 capsule, Rfl: 0   cetirizine (ZYRTEC) 10 MG tablet, Take 10 mg by mouth daily., Disp: , Rfl:    metoprolol tartrate (LOPRESSOR) 100 MG tablet, Take 2 hours prior to CT, Disp: 1 tablet, Rfl: 0   rosuvastatin (CRESTOR) 5 MG tablet, Take 1 tablet (5 mg total) by mouth daily., Disp: 90 tablet, Rfl: 3   SYNTHROID 88 MCG tablet, Take 1 tablet (88 mcg total) by mouth daily., Disp: 90 tablet, Rfl: 0   traMADol (ULTRAM) 50 MG tablet, Take 1 tablet (50 mg total) by mouth every 6 (six) hours as needed., Disp: 8 tablet, Rfl:  0   omeprazole (PRILOSEC) 40 MG capsule, Take 1 capsule (40 mg total) by mouth daily., Disp: 30 capsule, Rfl: 3  Allergies  Allergen Reactions   Hydrocodone Hives    Palpitations          Objective:  Physical Exam  General: AAO x3, NAD  Dermatological: Skin is warm, dry and supple bilaterally. Nails x 10 are well manicured; remaining integument appears unremarkable at this time. There are no open sores, no preulcerative lesions, no rash or signs of infection present.  Vascular: Dorsalis Pedis artery and Posterior Tibial artery pedal pulses are 2/4 bilateral with immedate capillary fill time. There is no pain with calf compression, swelling, warmth, erythema.   Neruologic: Grossly intact via light touch bilateral.  Musculoskeletal: Moderate bunions present.  Minimal erythema and irritation of the bunion but there is no skin breakdown or warmth.  There is no crepitation or restriction with MPJ range of motion.  No hypermobility of the first ray.  Gait: Unassisted, Nonantalgic.       Assessment:   57 year old female with bunion deformity     Plan:  -Treatment options discussed including all alternatives, risks, and complications -Etiology of symptoms were discussed -X-rays were obtained and reviewed with the patient.  Multiple views of the foot were obtained.  Moderate bunions present.  No evidence of acute fracture. -We discussed the conservative as well as surgical treatment options.  I do think we should continue with conservative treatment.  At some point though we discussed surgical intervention symptoms progress.  No deformity or increased discomfort.  We discussed conservative measures include shoe modification offloading pad as well as department stretching, rehab exercises for toes.  Discussed supportive shoe gear.  Vivi Barrack DPM

## 2023-11-08 ENCOUNTER — Other Ambulatory Visit: Payer: Self-pay | Admitting: Medical Genetics

## 2023-11-15 ENCOUNTER — Ambulatory Visit: Payer: Commercial Managed Care - PPO | Admitting: Family Medicine

## 2023-11-21 ENCOUNTER — Other Ambulatory Visit: Payer: Self-pay | Admitting: Family Medicine

## 2023-11-21 ENCOUNTER — Other Ambulatory Visit (HOSPITAL_COMMUNITY): Payer: Self-pay

## 2023-11-21 ENCOUNTER — Other Ambulatory Visit: Payer: Self-pay

## 2023-11-21 MED ORDER — SYNTHROID 88 MCG PO TABS
88.0000 ug | ORAL_TABLET | Freq: Every day | ORAL | 0 refills | Status: DC
  Filled 2023-11-21: qty 90, 90d supply, fill #0

## 2023-11-21 NOTE — Telephone Encounter (Signed)
 Marland Kitchen

## 2023-11-22 ENCOUNTER — Other Ambulatory Visit: Payer: Self-pay

## 2023-12-02 DIAGNOSIS — H524 Presbyopia: Secondary | ICD-10-CM | POA: Diagnosis not present

## 2024-02-15 ENCOUNTER — Other Ambulatory Visit (HOSPITAL_COMMUNITY): Payer: Self-pay

## 2024-02-15 ENCOUNTER — Other Ambulatory Visit: Payer: Self-pay | Admitting: Family Medicine

## 2024-02-15 ENCOUNTER — Telehealth: Payer: Self-pay

## 2024-02-15 MED ORDER — SYNTHROID 88 MCG PO TABS
88.0000 ug | ORAL_TABLET | Freq: Every day | ORAL | 0 refills | Status: DC
  Filled 2024-02-15: qty 90, 90d supply, fill #0

## 2024-02-15 NOTE — Telephone Encounter (Signed)
 Received refill request for synthroid . Last visit Dr. Paulla Bossier wanted patient to return in 6 months (February). I left vm for patient to return my call to schedule a follow up. I did send in the refill. This will be the last refill until she schedules an appt.

## 2024-02-16 ENCOUNTER — Other Ambulatory Visit (HOSPITAL_COMMUNITY): Payer: Self-pay

## 2024-03-19 ENCOUNTER — Ambulatory Visit: Admitting: Family Medicine

## 2024-03-19 ENCOUNTER — Other Ambulatory Visit (HOSPITAL_COMMUNITY): Payer: Self-pay

## 2024-03-19 ENCOUNTER — Encounter: Payer: Self-pay | Admitting: Family Medicine

## 2024-03-19 VITALS — BP 100/62 | HR 91 | Temp 98.1°F | Ht 67.0 in | Wt 135.1 lb

## 2024-03-19 DIAGNOSIS — E785 Hyperlipidemia, unspecified: Secondary | ICD-10-CM

## 2024-03-19 DIAGNOSIS — E890 Postprocedural hypothyroidism: Secondary | ICD-10-CM | POA: Diagnosis not present

## 2024-03-19 DIAGNOSIS — K219 Gastro-esophageal reflux disease without esophagitis: Secondary | ICD-10-CM | POA: Diagnosis not present

## 2024-03-19 LAB — BASIC METABOLIC PANEL WITH GFR
BUN: 18 mg/dL (ref 6–23)
CO2: 27 meq/L (ref 19–32)
Calcium: 10 mg/dL (ref 8.4–10.5)
Chloride: 104 meq/L (ref 96–112)
Creatinine, Ser: 0.86 mg/dL (ref 0.40–1.20)
GFR: 75.53 mL/min (ref >60.00)
Glucose, Bld: 92 mg/dL (ref 70–99)
Potassium: 4.2 meq/L (ref 3.5–5.1)
Sodium: 138 meq/L (ref 135–145)

## 2024-03-19 LAB — CBC WITH DIFFERENTIAL/PLATELET
Basophils Absolute: 0.1 10*3/uL (ref 0.0–0.1)
Basophils Relative: 0.8 % (ref 0.0–3.0)
Eosinophils Absolute: 0.2 10*3/uL (ref 0.0–0.7)
Eosinophils Relative: 2.8 % (ref 0.0–5.0)
HCT: 44.4 % (ref 36.0–46.0)
Hemoglobin: 14.7 g/dL (ref 12.0–15.0)
Lymphocytes Relative: 34.6 % (ref 12.0–46.0)
Lymphs Abs: 2.2 10*3/uL (ref 0.7–4.0)
MCHC: 33.1 g/dL (ref 30.0–36.0)
MCV: 91.9 fl (ref 78.0–100.0)
Monocytes Absolute: 0.4 10*3/uL (ref 0.1–1.0)
Monocytes Relative: 5.7 % (ref 3.0–12.0)
Neutro Abs: 3.5 10*3/uL (ref 1.4–7.7)
Neutrophils Relative %: 56.1 % (ref 43.0–77.0)
Platelets: 297 10*3/uL (ref 150.0–400.0)
RBC: 4.83 Mil/uL (ref 3.87–5.11)
RDW: 13.4 % (ref 11.5–15.5)
WBC: 6.2 10*3/uL (ref 4.0–10.5)

## 2024-03-19 LAB — HEPATIC FUNCTION PANEL
ALT: 22 U/L (ref 0–35)
AST: 26 U/L (ref 0–37)
Albumin: 4.5 g/dL (ref 3.5–5.2)
Alkaline Phosphatase: 58 U/L (ref 39–117)
Bilirubin, Direct: 0.1 mg/dL (ref 0.0–0.3)
Total Bilirubin: 0.8 mg/dL (ref 0.2–1.2)
Total Protein: 7.1 g/dL (ref 6.0–8.3)

## 2024-03-19 LAB — LIPID PANEL
Cholesterol: 211 mg/dL — ABNORMAL HIGH (ref 0–200)
HDL: 76.7 mg/dL (ref >39.00)
LDL Cholesterol: 106 mg/dL — ABNORMAL HIGH (ref 0–99)
NonHDL: 134.29
Total CHOL/HDL Ratio: 3
Triglycerides: 143 mg/dL (ref 0.0–149.0)
VLDL: 28.6 mg/dL (ref 0.0–40.0)

## 2024-03-19 LAB — TSH: TSH: 1.22 u[IU]/mL (ref 0.35–5.50)

## 2024-03-19 MED ORDER — OMEPRAZOLE 40 MG PO CPDR
40.0000 mg | DELAYED_RELEASE_CAPSULE | Freq: Every day | ORAL | 3 refills | Status: AC
  Filled 2024-03-19: qty 30, 30d supply, fill #0
  Filled 2024-05-13: qty 30, 30d supply, fill #1
  Filled 2024-09-04: qty 30, 30d supply, fill #2

## 2024-03-19 NOTE — Progress Notes (Signed)
   Subjective:    Patient ID: Shelly James, female    DOB: 1965-10-31, 58 y.o.   MRN: [id number]  HPI Hyperlipidemia- chronic problem.  On Crestor  5mg  daily but not taking regularly.  Denies abd pain, N/V  GERD- pt reports she has had 3 episodes of CP in the last 4-5 months.  It wakes her from sleep.  Not anxious.  Sxs last 15-20 minutes.  Resolves spontaneously.  Had full cardiology workup and it was negative.  Has been in [place] x2 weeks and was eating and drinking out of her usual routine.  Hypothyroid- chronic problem, on Levothyroxine  88mcg daily.  Denies fatigue, changes to skin/hair/nails.   Review of Systems For ROS see HPI     Objective:   Physical Exam Vitals reviewed.  Constitutional:      General: She is not in acute distress.    Appearance: Normal appearance. She is well-developed. She is not ill-appearing.  HENT:     Head: Normocephalic and atraumatic.  Eyes:     Conjunctiva/sclera: Conjunctivae normal.     Pupils: Pupils are equal, round, and reactive to light.  Neck:     Thyroid : No thyromegaly.  Cardiovascular:     Rate and Rhythm: Normal rate and regular rhythm.     Pulses: Normal pulses.     Heart sounds: Normal heart sounds. No murmur heard. Pulmonary:     Effort: Pulmonary effort is normal. No respiratory distress.     Breath sounds: Normal breath sounds.  Abdominal:     General: There is no distension.     Palpations: Abdomen is soft.     Tenderness: There is no abdominal tenderness.  Musculoskeletal:     Cervical back: Normal range of motion and neck supple.     Right lower leg: No edema.     Left lower leg: No edema.  Lymphadenopathy:     Cervical: No cervical adenopathy.  Skin:    General: Skin is warm and dry.  Neurological:     Mental Status: She is alert and oriented to person, place, and time.  Psychiatric:        Behavior: Behavior normal.           Assessment & Plan:

## 2024-03-19 NOTE — Patient Instructions (Signed)
 Schedule your complete physical in 6 months We'll notify you of your lab results and make any changes if needed START the Omeprazole  daily x1-2 weeks and then stop to see it's needed If you find you need the acid reducer, let me know and I'll send in the lower dose Call with any questions or concerns Stay Safe!  Stay Healthy! Have a great summer!

## 2024-03-20 ENCOUNTER — Ambulatory Visit: Payer: Self-pay | Admitting: Family Medicine

## 2024-03-20 NOTE — Progress Notes (Signed)
 Pt has been notified.

## 2024-04-03 NOTE — Assessment & Plan Note (Signed)
Chronic problem.  Currently asymptomatic on Levothyroxine 59mg daily.  Check labs.  Adjust meds prn

## 2024-04-03 NOTE — Assessment & Plan Note (Signed)
 Deteriorated.  Suspect this is the cause of her chest pain.  Will start Omeprazole  40mg  daily x2 weeks and then decrease to 20mg  daily (OTC) and then stop if sxs permit.  Pt expressed understanding and is in agreement w/ plan.

## 2024-04-03 NOTE — Assessment & Plan Note (Signed)
 Chronic problem.  On Crestor  5mg  daily but not taking regularly.  Check labs and determine if meds are needed daily.

## 2024-04-09 ENCOUNTER — Encounter: Payer: Self-pay | Admitting: Family Medicine

## 2024-04-09 NOTE — Telephone Encounter (Signed)
 Please see message below from patient about GERD.

## 2024-04-09 NOTE — Telephone Encounter (Signed)
 Looking back at the July 1 visit with Dr. Mahlon, chest pain was thought to be related to reflux, and noted previous cardiology workup was negative.  If she is taking Prilosec consistently, can add Zantac  over-the-counter at bedtime and see if that is effective.  If any recurrence on this regimen I would recommend gastroenterology evaluation.  Happy to see her and discuss this plan if needed as well but I am okay with her starting Zantac  in the evening first.  Let me know if she has questions.

## 2024-05-01 ENCOUNTER — Other Ambulatory Visit (HOSPITAL_COMMUNITY): Payer: Self-pay

## 2024-05-01 MED ORDER — AMOXICILLIN 875 MG PO TABS
875.0000 mg | ORAL_TABLET | Freq: Two times a day (BID) | ORAL | 1 refills | Status: AC
  Filled 2024-05-01 (×2): qty 14, 7d supply, fill #0

## 2024-05-01 MED ORDER — HYDROCODONE-ACETAMINOPHEN 5-325 MG PO TABS
1.0000 | ORAL_TABLET | ORAL | 0 refills | Status: AC
  Filled 2024-05-01 (×2): qty 15, 3d supply, fill #0

## 2024-05-13 ENCOUNTER — Other Ambulatory Visit: Payer: Self-pay | Admitting: Cardiology

## 2024-05-14 ENCOUNTER — Other Ambulatory Visit: Payer: Self-pay

## 2024-05-14 ENCOUNTER — Other Ambulatory Visit (HOSPITAL_COMMUNITY): Payer: Self-pay

## 2024-05-14 MED ORDER — ROSUVASTATIN CALCIUM 5 MG PO TABS
5.0000 mg | ORAL_TABLET | Freq: Every day | ORAL | 3 refills | Status: AC
  Filled 2024-05-14: qty 90, 90d supply, fill #0
  Filled 2024-09-04: qty 90, 90d supply, fill #1

## 2024-05-28 ENCOUNTER — Other Ambulatory Visit: Payer: Self-pay | Admitting: Family Medicine

## 2024-05-28 ENCOUNTER — Encounter: Payer: Self-pay | Admitting: Family Medicine

## 2024-05-28 DIAGNOSIS — Z1283 Encounter for screening for malignant neoplasm of skin: Secondary | ICD-10-CM

## 2024-05-29 ENCOUNTER — Other Ambulatory Visit (HOSPITAL_COMMUNITY): Payer: Self-pay

## 2024-05-29 MED ORDER — SYNTHROID 88 MCG PO TABS
88.0000 ug | ORAL_TABLET | Freq: Every day | ORAL | 0 refills | Status: DC
  Filled 2024-05-29: qty 90, 90d supply, fill #0

## 2024-05-29 NOTE — Telephone Encounter (Signed)
 Patient notes has not had a skin check by Dermatology and is asking for a referral to have one done, last seen in July, okay to refer?

## 2024-06-01 ENCOUNTER — Other Ambulatory Visit (HOSPITAL_COMMUNITY): Payer: Self-pay

## 2024-07-03 ENCOUNTER — Other Ambulatory Visit: Payer: Self-pay | Admitting: Obstetrics and Gynecology

## 2024-07-03 DIAGNOSIS — Z1231 Encounter for screening mammogram for malignant neoplasm of breast: Secondary | ICD-10-CM

## 2024-07-14 ENCOUNTER — Ambulatory Visit
Admission: EM | Admit: 2024-07-14 | Discharge: 2024-07-14 | Disposition: A | Discharge status: Home / Self Care | Attending: Family Medicine | Admitting: Family Medicine

## 2024-07-14 DIAGNOSIS — J309 Allergic rhinitis, unspecified: Secondary | ICD-10-CM

## 2024-07-14 DIAGNOSIS — J988 Other specified respiratory disorders: Secondary | ICD-10-CM

## 2024-07-14 DIAGNOSIS — B9789 Other viral agents as the cause of diseases classified elsewhere: Secondary | ICD-10-CM

## 2024-07-14 DIAGNOSIS — J4530 Mild persistent asthma, uncomplicated: Secondary | ICD-10-CM | POA: Diagnosis not present

## 2024-07-14 MED ORDER — PREDNISONE 20 MG PO TABS: ORAL_TABLET | ORAL | 0 refills | Status: AC

## 2024-07-14 MED ORDER — PROMETHAZINE-DM 6.25-15 MG/5ML PO SYRP: 5.0000 mL | ORAL_SOLUTION | Freq: Every evening | ORAL | 0 refills | Status: AC | PRN

## 2024-07-14 NOTE — Discharge Instructions (Addendum)
 We will be using a trial of prednisone  to help you with your persistent coughing likely made worse by underlying allergies and asthma.  If you continue to have symptoms this coming Wednesday or Thursday then it would be very reasonable to try an oral antibiotic to address a secondary sinus infection.  Will be on standby.

## 2024-07-14 NOTE — ED Triage Notes (Addendum)
 Shelly James reports cough and runny that won't go away. Started last Saturday. Cough is worse at night. Treated with Dayquil, Nyquil, Mucin ex and other OTC medicine with no relief. Low back back while coughing that started yesterday.

## 2024-07-14 NOTE — ED Provider Notes (Signed)
 [facility]  Note:  This document was prepared using Conservation officer, historic buildings and may include unintentional dictation errors.  MRN: [id number] DOB: 1966-02-17  Subjective:   Shelly James is a 58 y.o. female presenting for 8-day history of persistent cough, drainage.  She has chronic perennial allergic rhinitis.  Takes allergy medications consistently.  She does have asthma as well, used her inhaler yesterday.  Reports that she feels improved but is concerned that her cough is not resolving and that she also felt some mild lateral chest wall, lateral thoracic back pain.  No smoking of any kind including cigarettes, cigars, vaping, marijuana use.  She has had great success with steroids in the past.  Does not feel that she has a full-blown sinus infection which she has before.  No current facility-administered medications for this encounter.  Current Outpatient Medications:    amoxicillin  (AMOXIL ) 875 MG tablet, Take 1 tablet (875 mg total) by mouth 2 (two) times daily until finished., Disp: 14 tablet, Rfl: 1   azelastine  (ASTELIN ) 0.1 % nasal spray, Place 1 spray into both nostrils 2 (two) times daily. Use in each nostril as directed, Disp: 30 mL, Rfl: 5   benzonatate  (TESSALON ) 200 MG capsule, Take 1 capsule (200 mg total) by mouth 3 (three) times daily as needed., Disp: 20 capsule, Rfl: 0   cetirizine (ZYRTEC) 10 MG tablet, Take 10 mg by mouth daily., Disp: , Rfl:    HYDROcodone -acetaminophen  (NORCO/VICODIN) 5-325 MG tablet, Take 1 tablet by mouth every 4-6 hours as needed for pain, Disp: 15 tablet, Rfl: 0   omeprazole  (PRILOSEC) 40 MG capsule, Take 1 capsule (40 mg total) by mouth daily., Disp: 30 capsule, Rfl: 3   rosuvastatin  (CRESTOR ) 5 MG tablet, Take 1 tablet (5 mg total) by mouth daily., Disp: 90 tablet, Rfl: 3   SYNTHROID  88 MCG tablet, Take 1 tablet (88 mcg total) by mouth daily., Disp: 90 tablet, Rfl: 0   traMADol  (ULTRAM ) 50 MG tablet, Take 1  tablet (50 mg total) by mouth every 6 (six) hours as needed., Disp: 8 tablet, Rfl: 0   Allergies  Allergen Reactions   Hydrocodone  Hives    Palpitations     Past Medical History:  Diagnosis Date   Allergy    Asthma    Graves disease    Hypothyroidism, postablative    Urticaria    Vaginal Pap smear, abnormal      Past Surgical History:  Procedure Laterality Date   COLONOSCOPY     ESOPHAGOGASTRODUODENOSCOPY     LEEP     TONSILLECTOMY AND ADENOIDECTOMY     wisdom      Family History  Problem Relation Age of Onset   Celiac disease Sister    Raynaud syndrome Sister    Cancer Father        mesothelioma   Thyroid  disease Maternal Grandmother    Allergic rhinitis Neg Hx    Angioedema Neg Hx    Asthma Neg Hx    Immunodeficiency Neg Hx    Urticaria Neg Hx    Eczema Neg Hx    Colon cancer Neg Hx    Stomach cancer Neg Hx     Social History   Tobacco Use   Smoking status: Never   Smokeless tobacco: Never  Vaping Use   Vaping status: Never Used  Substance Use Topics   Alcohol use: Yes    Comment: occ   Drug use: No    ROS  Objective:   Vitals: LMP 10/18/2017   Physical Exam Constitutional:      General: She is not in acute distress.    Appearance: Normal appearance. She is well-developed and normal weight. She is not ill-appearing, toxic-appearing or diaphoretic.  HENT:     Head: Normocephalic and atraumatic.     Right Ear: Tympanic membrane, ear canal and external ear normal. No drainage or tenderness. No middle ear effusion. There is no impacted cerumen. Tympanic membrane is not erythematous or bulging.     Left Ear: Tympanic membrane, ear canal and external ear normal. No drainage or tenderness.  No middle ear effusion. There is no impacted cerumen. Tympanic membrane is not erythematous or bulging.     Nose: Congestion present. No rhinorrhea.     Comments: Nasal mucosa boggy and edematous.    Mouth/Throat:     Mouth: Mucous membranes are moist. No  oral lesions.     Pharynx: No pharyngeal swelling, oropharyngeal exudate, posterior oropharyngeal erythema or uvula swelling.     Tonsils: No tonsillar exudate or tonsillar abscesses.     Comments: Thick streaks of postnasal drainage overlying pharynx.  Eyes:     General: No scleral icterus.       Right eye: No discharge.        Left eye: No discharge.     Extraocular Movements: Extraocular movements intact.     Right eye: Normal extraocular motion.     Left eye: Normal extraocular motion.     Conjunctiva/sclera: Conjunctivae normal.  Cardiovascular:     Rate and Rhythm: Normal rate and regular rhythm.     Heart sounds: Normal heart sounds. No murmur heard.    No friction rub. No gallop.  Pulmonary:     Effort: Pulmonary effort is normal. No respiratory distress.     Breath sounds: No stridor. No wheezing, rhonchi or rales.  Chest:     Chest wall: No tenderness.  Musculoskeletal:     Cervical back: Normal range of motion and neck supple.  Lymphadenopathy:     Cervical: No cervical adenopathy.  Skin:    General: Skin is warm and dry.  Neurological:     General: No focal deficit present.     Mental Status: She is alert and oriented to person, place, and time.  Psychiatric:        Mood and Affect: Mood normal.        Behavior: Behavior normal.     Assessment and Plan :   PDMP not reviewed this encounter.  1. Viral respiratory infection   2. Allergic rhinitis, unspecified seasonality, unspecified trigger   3. Mild persistent asthma without complication    Patient is very well-appearing.  Deferred imaging given clear cardiopulmonary exam, hemodynamically stable vital signs.  Suspect that she initially had a viral respiratory infection likely made worse by her allergic rhinitis, asthma.  Recommended supportive care, prednisone  to address an acute on chronic allergic rhinitis secondary to her viral URI.  If patient remains symptomatic by the time I return to work on 10/29 or  10/30, I was agreeable to write a prescription for patient for Augmentin  to address secondary bacterial sinusitis.   Christopher Savannah, PA-C 07/14/24 1037

## 2024-07-17 ENCOUNTER — Ambulatory Visit
Admission: RE | Admit: 2024-07-17 | Discharge: 2024-07-17 | Disposition: A | Discharge status: Home / Self Care | Source: Ambulatory Visit | Attending: Obstetrics and Gynecology | Admitting: Obstetrics and Gynecology

## 2024-07-17 DIAGNOSIS — Z1231 Encounter for screening mammogram for malignant neoplasm of breast: Secondary | ICD-10-CM | POA: Diagnosis not present

## 2024-07-19 ENCOUNTER — Other Ambulatory Visit: Payer: Self-pay | Admitting: Medical Genetics

## 2024-07-19 DIAGNOSIS — Z006 Encounter for examination for normal comparison and control in clinical research program: Secondary | ICD-10-CM

## 2024-08-09 DIAGNOSIS — R92333 Mammographic heterogeneous density, bilateral breasts: Secondary | ICD-10-CM | POA: Diagnosis not present

## 2024-09-04 ENCOUNTER — Other Ambulatory Visit: Payer: Self-pay | Admitting: Family Medicine

## 2024-09-04 ENCOUNTER — Telehealth: Payer: Self-pay

## 2024-09-04 ENCOUNTER — Other Ambulatory Visit (HOSPITAL_COMMUNITY): Payer: Self-pay

## 2024-09-04 ENCOUNTER — Other Ambulatory Visit: Payer: Self-pay

## 2024-09-04 MED ORDER — LEVOTHYROXINE SODIUM 88 MCG PO TABS
88.0000 ug | ORAL_TABLET | Freq: Every day | ORAL | 0 refills | Status: AC
  Filled 2024-09-04: qty 90, 90d supply, fill #0

## 2024-09-04 NOTE — Telephone Encounter (Signed)
 Copied from CRM #[id number]. Topic: Clinical - Medication Question >> Sep 04, 2024 10:15 AM Suzen RAMAN wrote: Reason for CRM: patient would like to know if she she could received the generic version of SYNTHROID  88 MCG tablet due to the cost of the name brand.     CB# [phone]

## 2024-09-04 NOTE — Telephone Encounter (Signed)
 Ok to switch to generic Levothyroxine  88mcg daily, #90, 1 refill

## 2024-09-04 NOTE — Telephone Encounter (Signed)
 Patient would like to change rx to levothyroxine  due to cost. Okay to change?

## 2024-09-05 ENCOUNTER — Other Ambulatory Visit (HOSPITAL_COMMUNITY): Payer: Self-pay

## 2024-09-05 NOTE — Telephone Encounter (Signed)
 Called pharmacy and let them know that okay to change to generic from brand name, called to inform the patient no answer LM letting her know we got her prescription changed.

## 2024-09-24 ENCOUNTER — Other Ambulatory Visit (HOSPITAL_COMMUNITY): Payer: Self-pay

## 2024-11-11 ENCOUNTER — Ambulatory Visit: Admitting: Podiatry

## 2024-12-31 ENCOUNTER — Ambulatory Visit: Admitting: Physician Assistant
